# Patient Record
Sex: Female | Born: 1950 | Race: Black or African American | Hispanic: No | Marital: Married | Smoking: Never smoker
Health system: Southern US, Community
[De-identification: ages and names within clinical notes are randomized; demographics above are authoritative.]

## PROBLEM LIST (undated history)

## (undated) DIAGNOSIS — T7840XA Allergy, unspecified, initial encounter: Secondary | ICD-10-CM

## (undated) DIAGNOSIS — K635 Polyp of colon: Secondary | ICD-10-CM

## (undated) DIAGNOSIS — L509 Urticaria, unspecified: Secondary | ICD-10-CM

## (undated) DIAGNOSIS — D472 Monoclonal gammopathy: Secondary | ICD-10-CM

## (undated) DIAGNOSIS — E669 Obesity, unspecified: Secondary | ICD-10-CM

## (undated) DIAGNOSIS — M797 Fibromyalgia: Secondary | ICD-10-CM

## (undated) DIAGNOSIS — D649 Anemia, unspecified: Secondary | ICD-10-CM

## (undated) DIAGNOSIS — G4730 Sleep apnea, unspecified: Secondary | ICD-10-CM

## (undated) DIAGNOSIS — N390 Urinary tract infection, site not specified: Secondary | ICD-10-CM

## (undated) DIAGNOSIS — M459 Ankylosing spondylitis of unspecified sites in spine: Secondary | ICD-10-CM

## (undated) DIAGNOSIS — I10 Essential (primary) hypertension: Secondary | ICD-10-CM

## (undated) DIAGNOSIS — E7800 Pure hypercholesterolemia, unspecified: Secondary | ICD-10-CM

## (undated) HISTORY — PX: BREAST SURGERY: SHX581

## (undated) HISTORY — DX: Fibromyalgia: M79.7

## (undated) HISTORY — DX: Allergy, unspecified, initial encounter: T78.40XA

## (undated) HISTORY — DX: Urinary tract infection, site not specified: N39.0

## (undated) HISTORY — PX: SHOULDER SURGERY: SHX246

## (undated) HISTORY — PX: COLONOSCOPY: SHX174

## (undated) HISTORY — PX: HERNIA MESH REMOVAL: SHX1745

## (undated) HISTORY — DX: Anemia, unspecified: D64.9

## (undated) HISTORY — DX: Urticaria, unspecified: L50.9

## (undated) HISTORY — PX: ABDOMINAL HYSTERECTOMY: SHX81

## (undated) HISTORY — DX: Obesity, unspecified: E66.9

## (undated) HISTORY — PX: POLYPECTOMY: SHX149

## (undated) HISTORY — DX: Sleep apnea, unspecified: G47.30

## (undated) HISTORY — DX: Polyp of colon: K63.5

---

## 1999-05-14 ENCOUNTER — Ambulatory Visit (HOSPITAL_COMMUNITY): Admission: RE | Admit: 1999-05-14 | Discharge: 1999-05-14 | Payer: Self-pay | Admitting: Family Medicine

## 1999-05-14 ENCOUNTER — Encounter: Payer: Self-pay | Admitting: Family Medicine

## 2001-01-01 ENCOUNTER — Ambulatory Visit (HOSPITAL_COMMUNITY): Admission: RE | Admit: 2001-01-01 | Discharge: 2001-01-01 | Payer: Self-pay | Admitting: Family Medicine

## 2001-01-01 ENCOUNTER — Encounter: Payer: Self-pay | Admitting: Family Medicine

## 2001-12-30 ENCOUNTER — Emergency Department (HOSPITAL_COMMUNITY): Admission: EM | Admit: 2001-12-30 | Discharge: 2001-12-30 | Payer: Self-pay | Admitting: *Deleted

## 2003-06-14 DIAGNOSIS — E669 Obesity, unspecified: Secondary | ICD-10-CM

## 2003-06-14 HISTORY — DX: Obesity, unspecified: E66.9

## 2004-04-13 ENCOUNTER — Ambulatory Visit: Payer: Self-pay | Admitting: Family Medicine

## 2006-04-18 ENCOUNTER — Other Ambulatory Visit: Admission: RE | Admit: 2006-04-18 | Discharge: 2006-04-18 | Payer: Self-pay | Admitting: Family Medicine

## 2007-04-03 ENCOUNTER — Emergency Department (HOSPITAL_COMMUNITY): Admission: EM | Admit: 2007-04-03 | Discharge: 2007-04-04 | Payer: Self-pay | Admitting: *Deleted

## 2009-06-08 ENCOUNTER — Emergency Department (HOSPITAL_COMMUNITY): Admission: EM | Admit: 2009-06-08 | Discharge: 2009-06-08 | Payer: Self-pay | Admitting: Emergency Medicine

## 2011-03-23 LAB — CBC
HCT: 36.5
Hemoglobin: 12.1
MCV: 84.4
RBC: 4.33
WBC: 7.2

## 2011-03-23 LAB — COMPREHENSIVE METABOLIC PANEL
Alkaline Phosphatase: 96
BUN: 6
CO2: 27
Chloride: 98
Creatinine, Ser: 0.99
GFR calc non Af Amer: 58 — ABNORMAL LOW
Glucose, Bld: 102 — ABNORMAL HIGH
Total Bilirubin: 0.8

## 2011-03-23 LAB — DIFFERENTIAL
Basophils Absolute: 0.1
Basophils Relative: 1
Eosinophils Relative: 5
Lymphocytes Relative: 37
Neutro Abs: 3.6

## 2011-03-23 LAB — POCT CARDIAC MARKERS
Myoglobin, poc: 87.8
Operator id: 4533

## 2011-03-23 LAB — D-DIMER, QUANTITATIVE: D-Dimer, Quant: 2.12 — ABNORMAL HIGH

## 2011-06-14 DIAGNOSIS — K635 Polyp of colon: Secondary | ICD-10-CM

## 2011-06-14 HISTORY — DX: Polyp of colon: K63.5

## 2011-10-01 ENCOUNTER — Encounter (HOSPITAL_COMMUNITY): Payer: Self-pay | Admitting: Family Medicine

## 2011-10-01 ENCOUNTER — Emergency Department (HOSPITAL_COMMUNITY): Payer: BC Managed Care – PPO

## 2011-10-01 ENCOUNTER — Emergency Department (HOSPITAL_COMMUNITY)
Admission: EM | Admit: 2011-10-01 | Discharge: 2011-10-01 | Disposition: A | Discharge status: Home / Self Care | Payer: BC Managed Care – PPO | Attending: Emergency Medicine | Admitting: Emergency Medicine

## 2011-10-01 DIAGNOSIS — IMO0002 Reserved for concepts with insufficient information to code with codable children: Secondary | ICD-10-CM

## 2011-10-01 DIAGNOSIS — S6990XA Unspecified injury of unspecified wrist, hand and finger(s), initial encounter: Secondary | ICD-10-CM

## 2011-10-01 DIAGNOSIS — W230XXA Caught, crushed, jammed, or pinched between moving objects, initial encounter: Secondary | ICD-10-CM | POA: Insufficient documentation

## 2011-10-01 DIAGNOSIS — Z23 Encounter for immunization: Secondary | ICD-10-CM | POA: Insufficient documentation

## 2011-10-01 DIAGNOSIS — S61209A Unspecified open wound of unspecified finger without damage to nail, initial encounter: Secondary | ICD-10-CM | POA: Insufficient documentation

## 2011-10-01 MED ORDER — HYDROCODONE-ACETAMINOPHEN 5-325 MG PO TABS: 1.0000 | ORAL_TABLET | ORAL | Status: AC | PRN

## 2011-10-01 MED ORDER — TETANUS-DIPHTH-ACELL PERTUSSIS 5-2.5-18.5 LF-MCG/0.5 IM SUSP
0.5000 mL | Freq: Once | INTRAMUSCULAR | Status: AC
  Administered 2011-10-01: 0.5 mL via INTRAMUSCULAR
  Filled 2011-10-01 (×2): qty 0.5

## 2011-10-01 NOTE — ED Notes (Signed)
Tiffany, PA at bedside for dermabond.

## 2011-10-01 NOTE — ED Notes (Signed)
Pt reports shutting right fingers in car door. Laceration noted to right pointer finger, bleeding controlled, CMS intact.

## 2011-10-01 NOTE — Discharge Instructions (Signed)

## 2011-10-01 NOTE — ED Provider Notes (Signed)
History     CSN: [id number]  Arrival date & time 10/01/11  1439   First MD Initiated Contact with Patient 10/01/11 [phone]      Chief Complaint  Patient presents with  . Hand Injury    (Consider location/radiation/quality/duration/timing/severity/associated sxs/prior treatment) HPI  Pt presents th eED with complaints of slamming right 2nd, 3rd and 4th fingers in door. She states that the pointer finger has a laceration but the bleeding is controlled at this time. The incident happened at 1:30pm today, the severity is mild, the pain is throbbing.   History reviewed. No pertinent past medical history.  Past Surgical History  Procedure Date  . Abdominal hysterectomy   . Breast surgery     cyst removal    History reviewed. No pertinent family history.  History  Substance Use Topics  . Smoking status: Never Smoker   . Smokeless tobacco: Not on file  . Alcohol Use: No    OB History    Grav Para Term Preterm Abortions TAB SAB Ect Mult Living                  Review of Systems   HEENT: denies blurry vision or change in hearing PULMONARY: Denies difficulty breathing and SOB CARDIAC: denies chest pain or heart palpitations MUSCULOSKELETAL: pt is ambulatory, denies being unable to ambulate ABDOMEN AL: denies abdominal pain GU: denies loss of bowel urinary control NEURO: denies numbness and tingling in extremities   Allergies  Review of patient's allergies indicates no known allergies.  Home Medications   Current Outpatient Rx  Name Route Sig Dispense Refill  . PRAVASTATIN SODIUM 40 MG PO TABS Oral Take 40 mg by mouth daily.    Marland Kitchen HYDROCODONE-ACETAMINOPHEN 5-325 MG PO TABS Oral Take 1 tablet by mouth every 4 (four) hours as needed for pain. 6 tablet 0    BP 149/78  Pulse 77  Temp(Src) 98.1 F (36.7 C) (Oral)  Resp 20  SpO2 99%  Physical Exam  Nursing note and vitals reviewed. Constitutional: She appears well-developed and well-nourished. No distress.  HENT:   Head: Normocephalic and atraumatic.  Eyes: Pupils are equal, round, and reactive to light.  Neck: Normal range of motion. Neck supple.  Cardiovascular: Normal rate and regular rhythm.   Pulmonary/Chest: Effort normal.  Abdominal: Soft.  Musculoskeletal:       Right hand: She exhibits tenderness, bony tenderness and swelling. She exhibits normal range of motion, normal two-point discrimination, normal capillary refill, no deformity and no laceration. normal sensation noted. Normal strength noted.       Hands: Neurological: She is alert.  Skin: Skin is warm and dry.    ED Course  Procedures (including critical care time)  Labs Reviewed - No data to display Dg Hand Complete Right  10/01/2011  *RADIOLOGY REPORT*  Clinical Data: Hand injury.  RIGHT HAND - COMPLETE 3+ VIEW  Comparison: None.  Findings: There is no evidence for acute fracture or subluxation. Soft tissue gas is identified along the volar aspect of the mid index finger.  No associated radiopaque foreign body in this region.  There is deformity of the proximal aspect of the distal second phalanx this is favored to represent degenerative change rather than acute fracture.  Correlation is recommended with history.  IMPRESSION:  1.  No definite evidence for acute fracture.  See above. 2.  Soft tissue gas along the anterior mid index finger.  Original Report Authenticated By: Patterson Hammersmith, M.D.     1.  Finger injury   2. Laceration       MDM  LACERATION REPAIR Performed by: Dorthula Matas Authorized by: Dorthula Matas Consent: Verbal consent obtained. Risks and benefits: risks, benefits and alternatives were discussed Consent given by: patient Patient identity confirmed: provided demographic data Prepped and Draped in normal sterile fashion Wound explored  Laceration Location: posterior MIP joint,  Laceration Length: 1 cm  No Foreign Bodies seen or palpated  Anesthesia: local infiltration  Local  anesthetic: None  Anesthetic total: none  Irrigation method: syringe Amount of cleaning: standard  Skin closure: skin glue  Number of sutures: none  Technique: skin glue  Patient tolerance: Patient tolerated the procedure well with no immediate complications.  Pt has been advised of the symptoms that warrant their return to the ED. Patient has voiced understanding and has agreed to follow-up with the PCP or specialist.        Dorthula Matas, PA 10/01/11 [phone]

## 2011-10-02 NOTE — ED Provider Notes (Signed)
Medical screening examination/treatment/procedure(s) were performed by non-physician practitioner and as supervising physician I was immediately available for consultation/collaboration.  Jacinda Kanady T Bana Borgmeyer, MD 10/02/11 2329 

## 2011-11-01 ENCOUNTER — Encounter (HOSPITAL_BASED_OUTPATIENT_CLINIC_OR_DEPARTMENT_OTHER): Payer: Self-pay

## 2011-11-01 ENCOUNTER — Emergency Department (HOSPITAL_BASED_OUTPATIENT_CLINIC_OR_DEPARTMENT_OTHER)
Admission: EM | Admit: 2011-11-01 | Discharge: 2011-11-01 | Disposition: A | Discharge status: Home / Self Care | Payer: Worker's Compensation | Attending: Emergency Medicine | Admitting: Emergency Medicine

## 2011-11-01 DIAGNOSIS — S0083XA Contusion of other part of head, initial encounter: Secondary | ICD-10-CM

## 2011-11-01 DIAGNOSIS — S0003XA Contusion of scalp, initial encounter: Secondary | ICD-10-CM | POA: Insufficient documentation

## 2011-11-01 DIAGNOSIS — Y9269 Other specified industrial and construction area as the place of occurrence of the external cause: Secondary | ICD-10-CM | POA: Insufficient documentation

## 2011-11-01 DIAGNOSIS — W208XXA Other cause of strike by thrown, projected or falling object, initial encounter: Secondary | ICD-10-CM | POA: Insufficient documentation

## 2011-11-01 DIAGNOSIS — IMO0002 Reserved for concepts with insufficient information to code with codable children: Secondary | ICD-10-CM | POA: Insufficient documentation

## 2011-11-01 DIAGNOSIS — R51 Headache: Secondary | ICD-10-CM | POA: Insufficient documentation

## 2011-11-01 DIAGNOSIS — Y990 Civilian activity done for income or pay: Secondary | ICD-10-CM | POA: Insufficient documentation

## 2011-11-01 HISTORY — DX: Pure hypercholesterolemia, unspecified: E78.00

## 2011-11-01 MED ORDER — ACETAMINOPHEN 325 MG PO TABS: 975.0000 mg | ORAL_TABLET | Freq: Once | ORAL | Status: DC

## 2011-11-01 NOTE — ED Notes (Signed)
Spoke with pts HR director at work and was told that we do not need a UDS for this incident. She stated that it is [place] and they are not allowed to require that.  Lindsay Bass is the HR director for [employer].

## 2011-11-01 NOTE — ED Notes (Signed)
[facility] report-pt was at work-bent over/under desk-knocked a 10-15 lb metal box onto head-was struck left cheek-no break in skin-no LOC-no treatment PTA

## 2011-11-01 NOTE — Discharge Instructions (Signed)
Contusion A contusion is a deep bruise. Contusions are the result of an injury that caused bleeding under the skin. The contusion may turn blue, purple, or yellow. Minor injuries will give you a painless contusion, but more severe contusions may stay painful and swollen for a few weeks.  CAUSES  A contusion is usually caused by a blow, trauma, or direct force to an area of the body. SYMPTOMS   Swelling and redness of the injured area.   Bruising of the injured area.   Tenderness and soreness of the injured area.   Pain.  DIAGNOSIS  The diagnosis can be made by taking a history and physical exam. An X-ray, CT scan, or MRI may be needed to determine if there were any associated injuries, such as fractures. TREATMENT  Specific treatment will depend on what area of the body was injured. In general, the best treatment for a contusion is resting, icing, elevating, and applying cold compresses to the injured area. Over-the-counter medicines may also be recommended for pain control. Ask your caregiver what the best treatment is for your contusion. HOME CARE INSTRUCTIONS   Put ice on the injured area.   Put ice in a plastic bag.   Place a towel between your skin and the bag.   Leave the ice on for 15 to 20 minutes, 3 to 4 times a day.   Only take over-the-counter or prescription medicines for pain, discomfort, or fever as directed by your caregiver. Your caregiver may recommend avoiding anti-inflammatory medicines (aspirin, ibuprofen, and naproxen) for 48 hours because these medicines may increase bruising.   Rest the injured area.   If possible, elevate the injured area to reduce swelling.  SEEK IMMEDIATE MEDICAL CARE IF:   You have increased bruising or swelling.   You have pain that is getting worse.   Your swelling or pain is not relieved with medicines.  MAKE SURE YOU:   Understand these instructions.   Will watch your condition.   Will get help right away if you are not  doing well or get worse.  Document Released: 03/09/2005 Document Revised: 05/19/2011 Document Reviewed: 04/04/2011 [facility] Patient Information 2012 [place], [place].   Contact her supervisor to see your work comp clinic if you do not feel that you can return to work full duty tomorrow. Take Tylenol as directed for pain

## 2011-11-01 NOTE — ED Notes (Signed)
Pt repots that a metal 10lb meter fell from a table and struck her in the face-approx 2pm-no LOC

## 2011-11-01 NOTE — ED Provider Notes (Signed)
History     CSN: [id number]  Arrival date & time 11/01/11  1458   First MD Initiated Contact with Patient 11/01/11 1611      Chief Complaint  Patient presents with  . Head Injury    (Consider location/radiation/quality/duration/timing/severity/associated sxs/prior treatment) HPI Reports that a metal meter weighing approximately 10 pounds fell from a height of approximately 3 feet striking her in the left face 2 PM today complains of left facial pain and an abrasion at right forearm as a result of the event no other complaint pain is mild not made better or worse by anything no loss of consciousness no visual changes no hearing changes no neck pain no other associated symptoms pain is not made better or worse by anything, nonradiating Past Medical History  Diagnosis Date  . High cholesterol     Past Surgical History  Procedure Date  . Abdominal hysterectomy   . Breast surgery     cyst removal    No family history on file.  History  Substance Use Topics  . Smoking status: Never Smoker   . Smokeless tobacco: Not on file  . Alcohol Use: No    OB History    Grav Para Term Preterm Abortions TAB SAB Ect Mult Living                  Review of Systems  Constitutional: Negative.   HENT: Negative.        Left facial pain  Respiratory: Negative.   Cardiovascular: Negative.   Gastrointestinal: Negative.   Musculoskeletal: Negative.   Skin: Positive for wound.       Abrasion right forearm  Neurological: Negative.   Hematological: Negative.   Psychiatric/Behavioral: Negative.     Allergies  Review of patient's allergies indicates no known allergies.  Home Medications   Current Outpatient Rx  Name Route Sig Dispense Refill  . ACETAMINOPHEN ER 650 MG PO TBCR Oral Take 650 mg by mouth every 8 (eight) hours as needed. Patient used this medication for pain.    . ONE-A-DAY 50 PLUS PO Oral Take 1 tablet by mouth daily.    Marland Kitchen NAPROXEN SODIUM 220 MG PO TABS Oral Take 440 mg  by mouth 2 (two) times daily with a meal. Patient used this medication for her toothache.    Marland Kitchen PRAVASTATIN SODIUM 40 MG PO TABS Oral Take 40 mg by mouth daily.      There were no vitals taken for this visit.  Physical Exam  Nursing note and vitals reviewed. Constitutional: She is oriented to person, place, and time. She appears well-developed and well-nourished.  HENT:  Head: Normocephalic and atraumatic.       No soft tissue swelling no facial asymmetry Minimally tender at left cheek, no ecchymosis poor dentition no trismus no malocclusion  Eyes: Conjunctivae are normal. Pupils are equal, round, and reactive to light.  Neck: Neck supple. No tracheal deviation present. No thyromegaly present.  Cardiovascular: Normal rate and regular rhythm.   No murmur heard. Pulmonary/Chest: Effort normal and breath sounds normal.  Abdominal: Soft. Bowel sounds are normal. She exhibits no distension. There is no tenderness.       Obese  Musculoskeletal: Normal range of motion. She exhibits no edema and no tenderness.       Entire spine nontender  Neurological: She is alert and oriented to person, place, and time. Coordination normal.       Gait normal Romberg normal pronator normal  Skin: Skin is warm and  dry. No rash noted.       Dime sized abrasion right volar forearm, clean appearing  Psychiatric: She has a normal mood and affect.    ED Course  Procedures (including critical care time)  Labs Reviewed - No data to display No results found.   No diagnosis found.    MDM  Imaging not indicated. Discussed with patient who agrees. Patient current on tetanus immunizations Patient declines pain medicine in the ED Plan Tylenol as needed Followup with workers comp clinic if cannot return to full duty tomorrow Diagnosis #1 contusion left face #2 abrasion right forearm        Doug Sou, MD 11/01/11 1610

## 2012-02-09 ENCOUNTER — Encounter: Payer: Self-pay | Admitting: Gastroenterology

## 2012-03-08 ENCOUNTER — Encounter: Payer: Self-pay | Admitting: Gastroenterology

## 2012-03-29 ENCOUNTER — Ambulatory Visit (AMBULATORY_SURGERY_CENTER): Payer: BC Managed Care – PPO | Admitting: *Deleted

## 2012-03-29 VITALS — Ht 61.0 in | Wt 189.6 lb

## 2012-03-29 DIAGNOSIS — Z86010 Personal history of colonic polyps: Secondary | ICD-10-CM

## 2012-03-29 DIAGNOSIS — Z1211 Encounter for screening for malignant neoplasm of colon: Secondary | ICD-10-CM

## 2012-03-29 MED ORDER — PEG-KCL-NACL-NASULF-NA ASC-C 100 G PO SOLR: ORAL | Status: DC

## 2012-03-29 NOTE — Progress Notes (Signed)
No allergies to eggs or soy products 

## 2012-04-06 ENCOUNTER — Ambulatory Visit (AMBULATORY_SURGERY_CENTER): Payer: BC Managed Care – PPO | Admitting: Gastroenterology

## 2012-04-06 ENCOUNTER — Encounter: Payer: Self-pay | Admitting: Gastroenterology

## 2012-04-06 VITALS — BP 123/77 | HR 74 | Temp 97.8°F | Resp 21 | Ht 61.0 in | Wt 189.0 lb

## 2012-04-06 DIAGNOSIS — Z1211 Encounter for screening for malignant neoplasm of colon: Secondary | ICD-10-CM

## 2012-04-06 DIAGNOSIS — D126 Benign neoplasm of colon, unspecified: Secondary | ICD-10-CM

## 2012-04-06 DIAGNOSIS — D129 Benign neoplasm of anus and anal canal: Secondary | ICD-10-CM

## 2012-04-06 DIAGNOSIS — D128 Benign neoplasm of rectum: Secondary | ICD-10-CM

## 2012-04-06 MED ORDER — SODIUM CHLORIDE 0.9 % IV SOLN: 500.0000 mL | INTRAVENOUS | Status: DC

## 2012-04-06 NOTE — Op Note (Signed)
[facility] [address] [place], [address]   COLONOSCOPY PROCEDURE REPORT  PATIENT: Lindsay Bass, Lindsay Bass  MR#: [id number] BIRTHDATE: 23-Nov-1950 , 61  yrs. old GENDER: Female ENDOSCOPIST: Mardella Layman, MD, [facility] REFERRED BY: PROCEDURE DATE:  04/06/2012 PROCEDURE:   Colonoscopy with biopsy ASA CLASS:   Class II INDICATIONS:average risk patient for colon cancer. MEDICATIONS: propofol (Diprivan) 200mg  IV  DESCRIPTION OF PROCEDURE:   After the risks and benefits and of the procedure were explained, informed consent was obtained.  A digital rectal exam revealed no abnormalities of the rectum.    The LB CF-[id number]  endoscope was introduced through the anus and advanced to the cecum, which was identified by both the appendix and ileocecal valve .  The quality of the prep was excellent, using MoviPrep .  The instrument was then slowly withdrawn as the colon was fully examined.     COLON FINDINGS: A normal appearing cecum, ileocecal valve, and appendiceal orifice were identified.  The ascending, hepatic flexure, transverse, splenic flexure, descending, sigmoid colon and rectum appeared unremarkable.  No polyps or cancers were seen.  A biopsy was performed.     Retroflexed views revealed no abnormalities.     The scope was then withdrawn from the patient and the procedure completed.  COMPLICATIONS: There were no complications. ENDOSCOPIC IMPRESSION: Normal colon; biopsy was performed ...r/o adenoma...  RECOMMENDATIONS: 1.  Repeat colonoscopy in 5 years if polyp adenomatous; otherwise 10 years 2.  Await pathology results 3.  Continue current medications   REPEAT EXAM:  XB:[id number] White, MD  _______________________________ eSigned:  Mardella Layman, MD, [facility] 04/06/2012 2:19 PM

## 2012-04-06 NOTE — Patient Instructions (Addendum)

## 2012-04-09 ENCOUNTER — Telehealth: Payer: Self-pay | Admitting: *Deleted

## 2012-04-09 NOTE — Telephone Encounter (Signed)
  Follow up Call-  Call back number 04/06/2012  Post procedure Call Back phone  # [phone]  Permission to leave phone message Yes     No answer and answering machine did not pick up to leave a message.

## 2012-04-10 ENCOUNTER — Encounter: Payer: Self-pay | Admitting: Gastroenterology

## 2013-06-13 DIAGNOSIS — N390 Urinary tract infection, site not specified: Secondary | ICD-10-CM

## 2013-06-13 DIAGNOSIS — M797 Fibromyalgia: Secondary | ICD-10-CM

## 2013-06-13 HISTORY — DX: Fibromyalgia: M79.7

## 2013-06-13 HISTORY — DX: Urinary tract infection, site not specified: N39.0

## 2013-10-27 IMAGING — CR DG HAND COMPLETE 3+V*R*
3 series · 3 of 3 positions shown · non-contrast
Comparison: None.

CLINICAL DATA: Hand injury.

RIGHT HAND - COMPLETE 3+ VIEW

[x hand pa right]
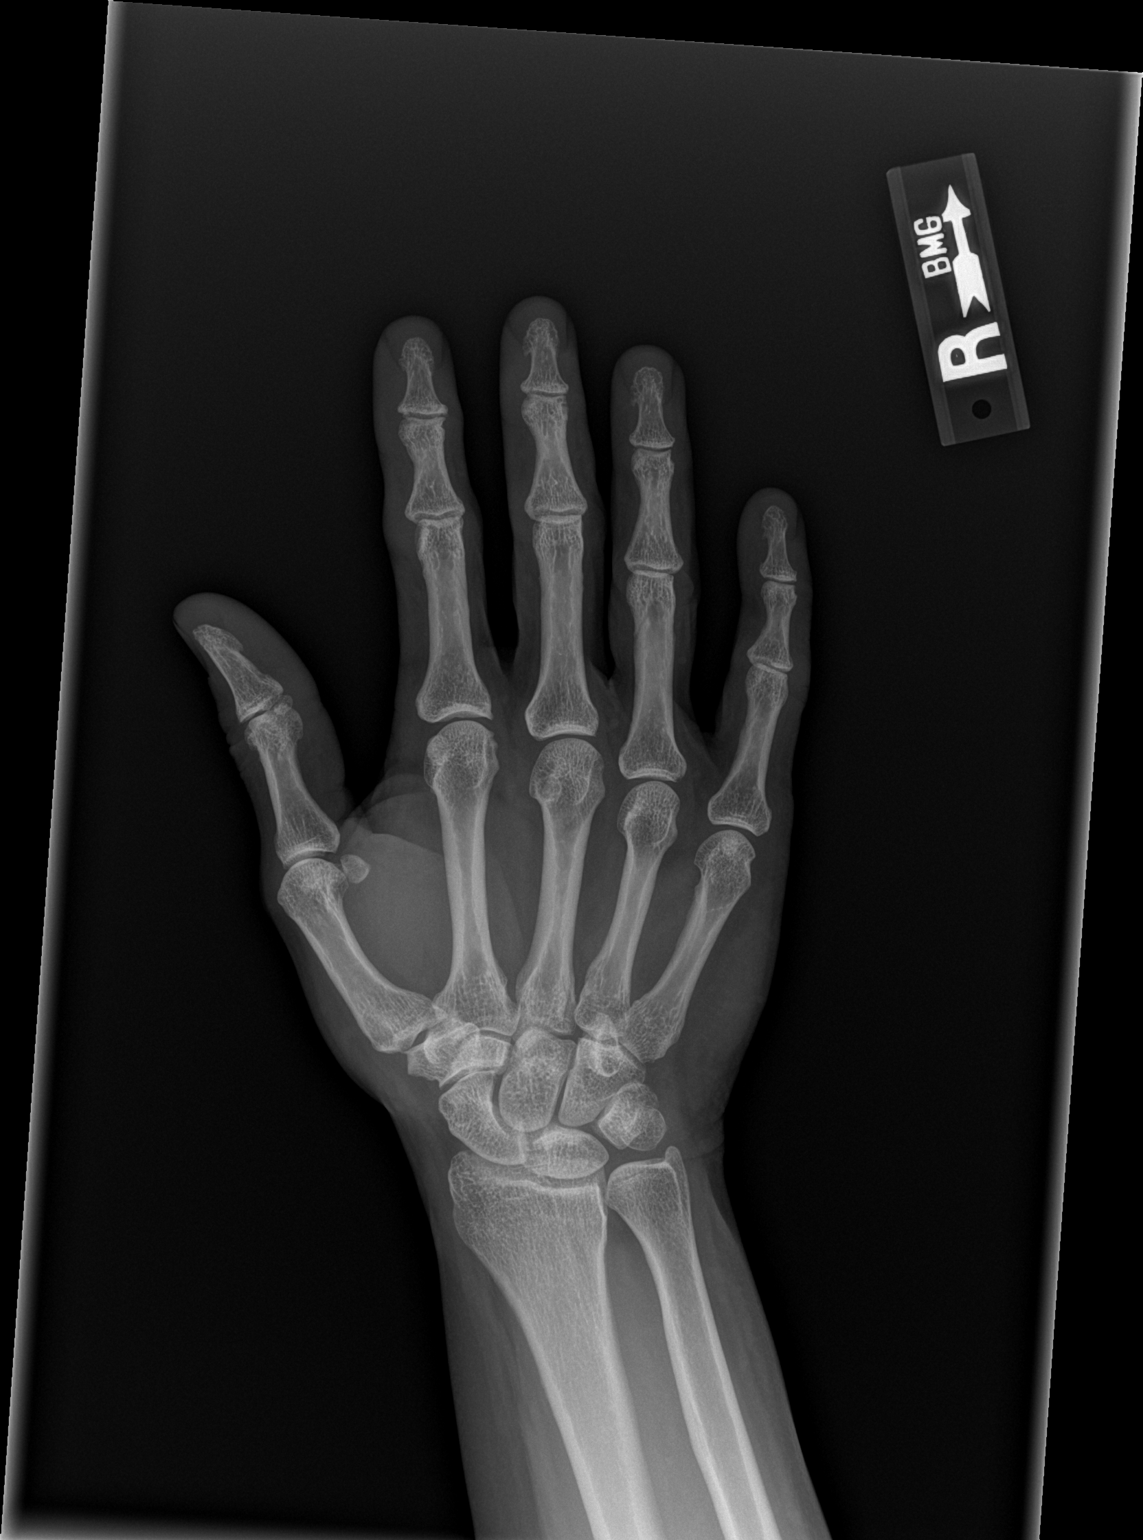

[x hand obl right]
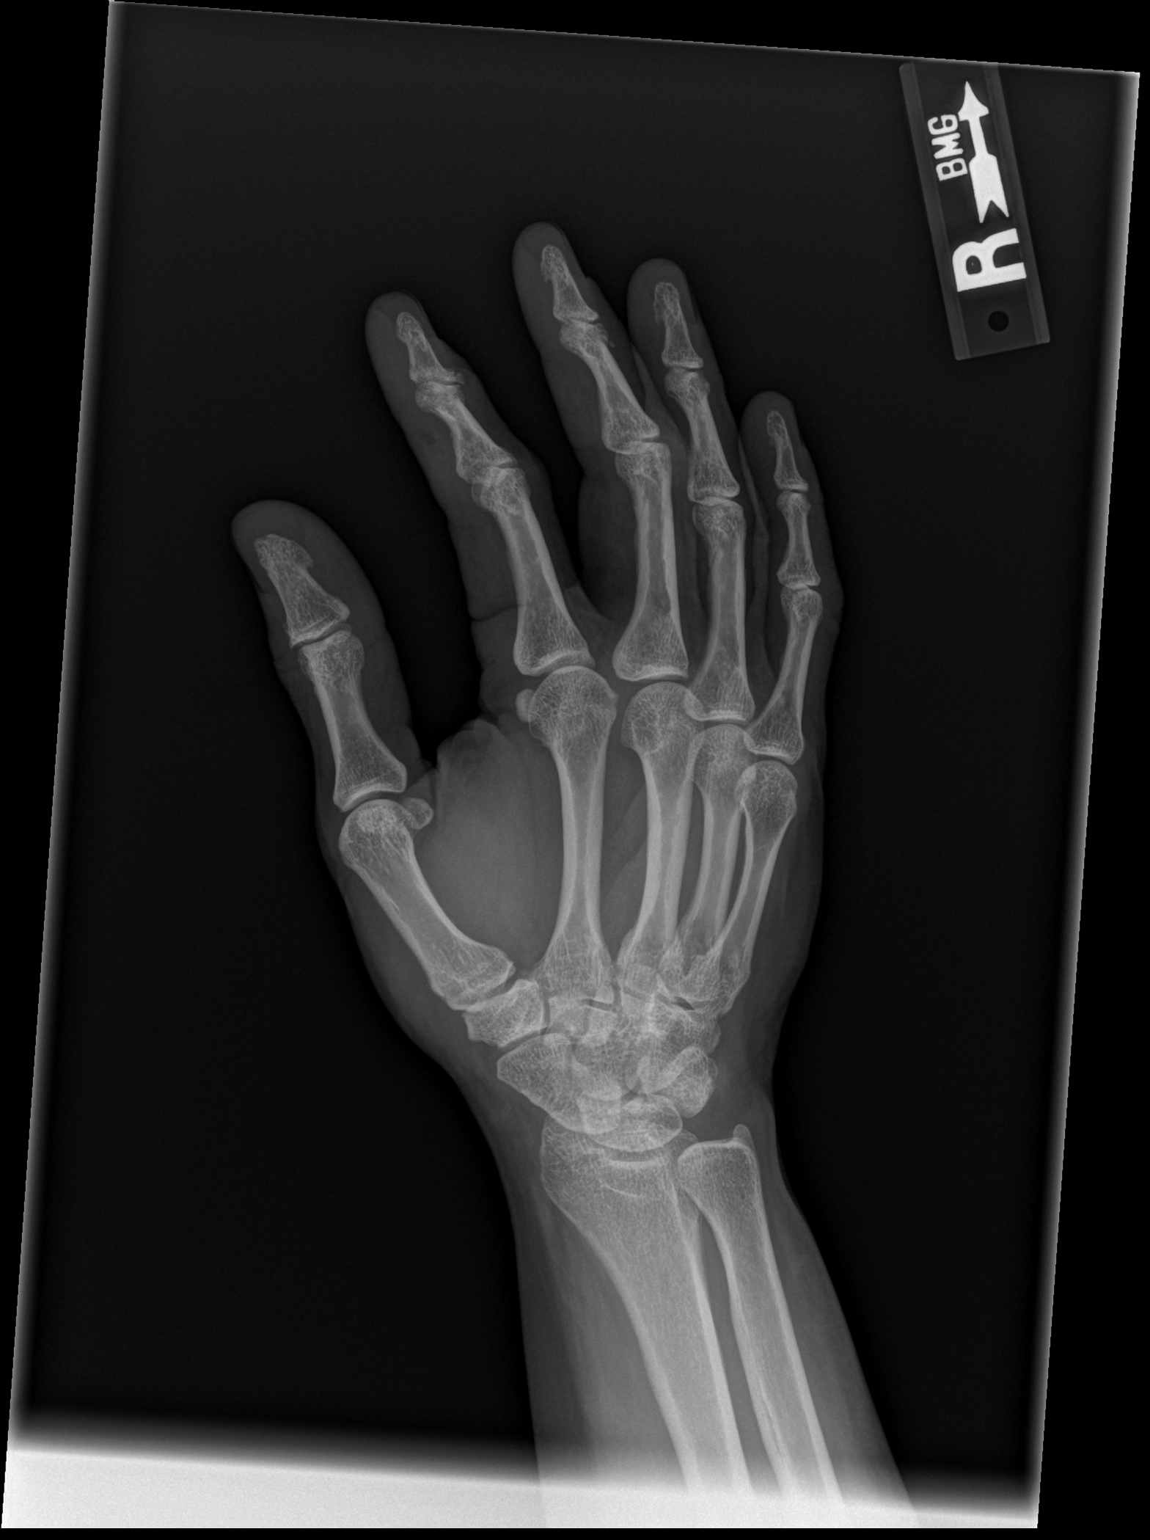

[x hand lat right]
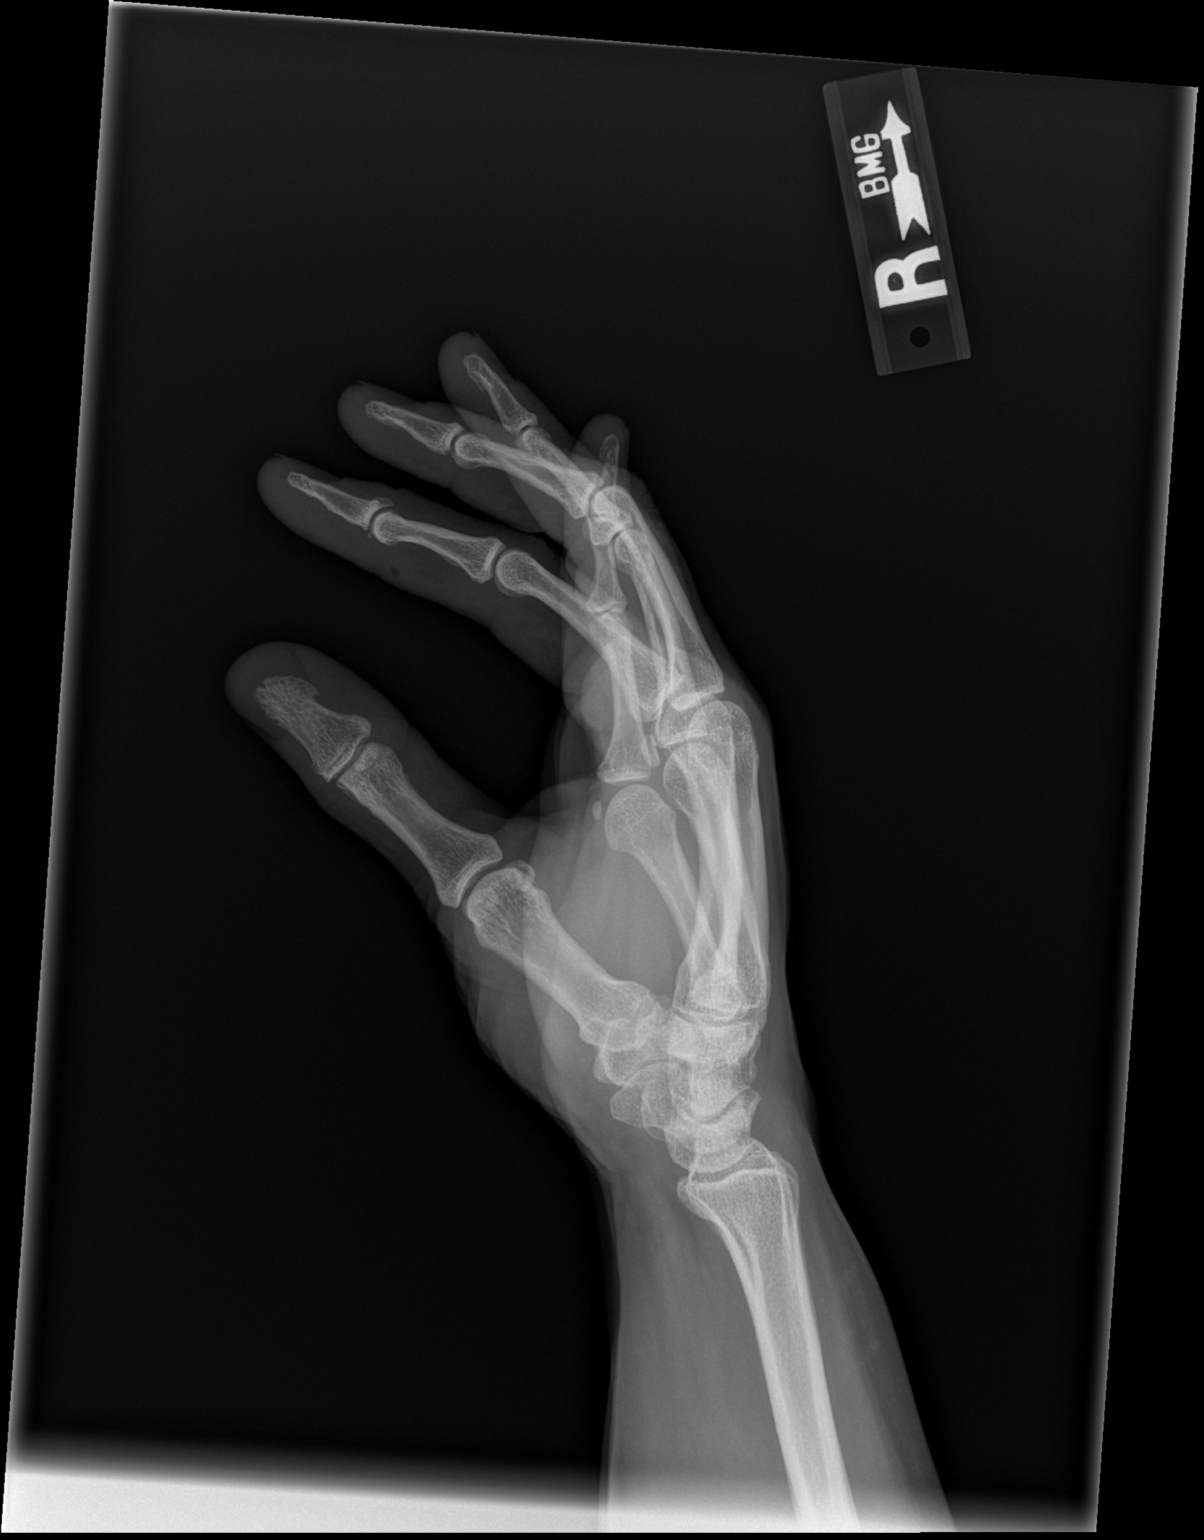

[3 of 3 positions shown; findings below may reference images not displayed]

FINDINGS: There is no evidence for acute fracture or subluxation.
Soft tissue gas is identified along the volar aspect of the mid
index finger.  No associated radiopaque foreign body in this
region.  There is deformity of the proximal aspect of the distal
second phalanx this is favored to represent degenerative change
rather than acute fracture.  Correlation is recommended with
history.
IMPRESSION: 1.  No definite evidence for acute fracture.  See above.
2.  Soft tissue gas along the anterior mid index finger.

## 2014-04-03 ENCOUNTER — Emergency Department (HOSPITAL_COMMUNITY)
Admission: EM | Admit: 2014-04-03 | Discharge: 2014-04-03 | Disposition: A | Discharge status: Home / Self Care | Payer: BC Managed Care – PPO | Attending: Emergency Medicine | Admitting: Emergency Medicine

## 2014-04-03 ENCOUNTER — Encounter (HOSPITAL_COMMUNITY): Payer: Self-pay | Admitting: Emergency Medicine

## 2014-04-03 ENCOUNTER — Emergency Department (HOSPITAL_COMMUNITY): Payer: BC Managed Care – PPO

## 2014-04-03 DIAGNOSIS — G4730 Sleep apnea, unspecified: Secondary | ICD-10-CM | POA: Diagnosis not present

## 2014-04-03 DIAGNOSIS — M25512 Pain in left shoulder: Secondary | ICD-10-CM | POA: Diagnosis not present

## 2014-04-03 DIAGNOSIS — R51 Headache: Secondary | ICD-10-CM | POA: Diagnosis not present

## 2014-04-03 DIAGNOSIS — R42 Dizziness and giddiness: Secondary | ICD-10-CM | POA: Diagnosis not present

## 2014-04-03 DIAGNOSIS — R079 Chest pain, unspecified: Secondary | ICD-10-CM | POA: Insufficient documentation

## 2014-04-03 DIAGNOSIS — Z79899 Other long term (current) drug therapy: Secondary | ICD-10-CM | POA: Diagnosis not present

## 2014-04-03 DIAGNOSIS — E780 Pure hypercholesterolemia: Secondary | ICD-10-CM | POA: Diagnosis not present

## 2014-04-03 DIAGNOSIS — Z9981 Dependence on supplemental oxygen: Secondary | ICD-10-CM | POA: Insufficient documentation

## 2014-04-03 LAB — BASIC METABOLIC PANEL
ANION GAP: 12 (ref 5–15)
BUN: 12 mg/dL (ref 6–23)
CO2: 25 meq/L (ref 19–32)
CREATININE: 0.96 mg/dL (ref 0.50–1.10)
Calcium: 9.3 mg/dL (ref 8.4–10.5)
Chloride: 103 mEq/L (ref 96–112)
GFR calc Af Amer: 72 mL/min — ABNORMAL LOW (ref >90)
GFR calc non Af Amer: 63 mL/min — ABNORMAL LOW (ref >90)
GLUCOSE: 96 mg/dL (ref 70–99)
POTASSIUM: 3.8 meq/L (ref 3.7–5.3)
SODIUM: 140 meq/L (ref 137–147)

## 2014-04-03 LAB — I-STAT TROPONIN, ED
TROPONIN I, POC: 0 ng/mL (ref 0.00–0.08)
Troponin i, poc: 0 ng/mL (ref 0.00–0.08)

## 2014-04-03 LAB — CBC
HCT: 34.3 % — ABNORMAL LOW (ref 36.0–46.0)
HEMOGLOBIN: 11.2 g/dL — AB (ref 12.0–15.0)
MCH: 27.9 pg (ref 26.0–34.0)
MCHC: 32.7 g/dL (ref 30.0–36.0)
MCV: 85.5 fL (ref 78.0–100.0)
Platelets: 159 10*3/uL (ref 150–400)
RBC: 4.01 MIL/uL (ref 3.87–5.11)
RDW: 12.5 % (ref 11.5–15.5)
WBC: 6.3 10*3/uL (ref 4.0–10.5)

## 2014-04-03 LAB — PRO B NATRIURETIC PEPTIDE: Pro B Natriuretic peptide (BNP): 116.1 pg/mL (ref 0–125)

## 2014-04-03 MED ORDER — ASPIRIN 81 MG PO CHEW
324.0000 mg | CHEWABLE_TABLET | Freq: Once | ORAL | Status: AC
  Administered 2014-04-03: 324 mg via ORAL
  Filled 2014-04-03: qty 4

## 2014-04-03 MED ORDER — ACETAMINOPHEN 500 MG PO TABS
1000.0000 mg | ORAL_TABLET | Freq: Once | ORAL | Status: AC
  Administered 2014-04-03: 1000 mg via ORAL
  Filled 2014-04-03: qty 2

## 2014-04-03 NOTE — ED Provider Notes (Signed)
CSN: [id number]     Arrival date & time 04/03/14  1338 History   First MD Initiated Contact with Patient 04/03/14 1719     Chief Complaint  Patient presents with  . Chest Pain  . Shoulder Pain     (Consider location/radiation/quality/duration/timing/severity/associated sxs/prior Treatment) HPI Comments: 63 year old female with high cholesterol, sleep apnea history presents after episode of sharp central chest pain this started after eating lunch. Patient had mild lightheadedness at the time. Patient has had left shoulder pain and arm pain for the past month with random association. Patient denies any exertional symptoms or diaphoresis. Patient denies any cardiac history or blood clot history, no recent surgeries, no active cancer, no hemoptysis, no unilateral leg symptoms.  Patient has no symptoms currently except for mild frontal headache that is similar to previous. Headache is gradual onset and improved with over-the-counter medications. Chest pain was sharp and lasted 10 minutes no focal radiation.  Patient is a 63 y.o. female presenting with chest pain and shoulder pain. The history is provided by the patient.  Chest Pain Associated symptoms: headache   Associated symptoms: no abdominal pain, no back pain, no fever, no shortness of breath and not vomiting   Shoulder Pain Associated symptoms include chest pain and headaches. Pertinent negatives include no abdominal pain and no shortness of breath.    Past Medical History  Diagnosis Date  . High cholesterol   . Allergy   . Sleep apnea     sometimes wear CPAP   Past Surgical History  Procedure Laterality Date  . Abdominal hysterectomy    . Breast surgery      cyst removal, right  . Colonoscopy    . Polypectomy     Family History  Problem Relation Age of Onset  . Colon cancer Neg Hx   . Esophageal cancer Neg Hx   . Stomach cancer Neg Hx   . Rectal cancer Neg Hx   . Breast cancer Maternal Grandmother   . Ovarian cancer  Paternal Grandmother    History  Substance Use Topics  . Smoking status: Never Smoker   . Smokeless tobacco: Never Used  . Alcohol Use: No   OB History   Grav Para Term Preterm Abortions TAB SAB Ect Mult Living                 Review of Systems  Constitutional: Negative for fever and chills.  HENT: Negative for congestion.   Eyes: Negative for visual disturbance.  Respiratory: Negative for shortness of breath.   Cardiovascular: Positive for chest pain.  Gastrointestinal: Negative for vomiting and abdominal pain.  Genitourinary: Negative for dysuria and flank pain.  Musculoskeletal: Negative for back pain, neck pain and neck stiffness.  Skin: Negative for rash.  Neurological: Positive for light-headedness and headaches. Negative for syncope.      Allergies  Review of patient's allergies indicates no known allergies.  Home Medications   Prior to Admission medications   Medication Sig Start Date End Date Taking? Authorizing Provider  acetaminophen (TYLENOL) 500 MG tablet Take 500 mg by mouth daily as needed for moderate pain (pain).   Yes Historical Provider, MD  atorvastatin (LIPITOR) 80 MG tablet Take 80 mg by mouth daily.   Yes Historical Provider, MD  Calcium Carbonate 500 MG CHEW Chew 2 tablets by mouth daily.   Yes Historical Provider, MD  VITAMIN E PO Take 1 tablet by mouth daily as needed (cold symptoms).   Yes Historical Provider, MD  zinc gluconate  50 MG tablet Take 50 mg by mouth daily as needed (cold symptoms).   Yes Historical Provider, MD   BP 144/88  Pulse 74  Temp(Src) 97.7 F (36.5 C) (Oral)  Resp 17  SpO2 98% Physical Exam  Nursing note and vitals reviewed. Constitutional: She is oriented to person, place, and time. She appears well-developed and well-nourished.  HENT:  Head: Normocephalic and atraumatic.  Eyes: Conjunctivae are normal. Right eye exhibits no discharge. Left eye exhibits no discharge.  Neck: Normal range of motion. Neck supple. No  tracheal deviation present.  Cardiovascular: Normal rate, regular rhythm and intact distal pulses.   No murmur heard. Pulmonary/Chest: Effort normal and breath sounds normal.  Abdominal: Soft. She exhibits no distension. There is no tenderness. There is no guarding.  Musculoskeletal: She exhibits no edema.  Neurological: She is alert and oriented to person, place, and time. No cranial nerve deficit. GCS eye subscore is 4. GCS verbal subscore is 5. GCS motor subscore is 6.  5+ strength in UE and LE with f/e at major joints. Sensation to palpation intact in UE and LE. CNs 2-12 grossly intact.  EOMFI.  PERRL.   Finger nose and coordination intact bilateral.   Visual fields intact to finger testing.   Skin: Skin is warm. No rash noted.  Psychiatric: She has a normal mood and affect.    ED Course  Procedures (including critical care time) Labs Review Labs Reviewed  CBC - Abnormal; Notable for the following:    Hemoglobin 11.2 (*)    HCT 34.3 (*)    All other components within normal limits  BASIC METABOLIC PANEL - Abnormal; Notable for the following:    GFR calc non Af Amer 63 (*)    GFR calc Af Amer 72 (*)    All other components within normal limits  PRO B NATRIURETIC PEPTIDE  I-STAT TROPOININ, ED  I-STAT TROPOININ, ED    Imaging Review Dg Chest 2 View (if Patient Has Fever And/or Copd)  04/03/2014   CLINICAL DATA:  Shortness of breath.  Weakness.  Initial encounter.  EXAM: CHEST  2 VIEW  COMPARISON:  04/03/2007  FINDINGS: Thoracic spondylosis. Mildly enlarged cardiopericardial silhouette. No edema. The lungs appear clear. No pleural effusion.  IMPRESSION: 1. Mildly enlarged cardiopericardial silhouette, without edema. 2. Thoracic spondylosis.   Electronically Signed   By: Sherryl Barters M.D.   On: 04/03/2014 15:11   Dg Shoulder Left  04/03/2014   CLINICAL DATA:  Left shoulder pain for the past month. No known injury.  EXAM: LEFT SHOULDER - 2+ VIEW  COMPARISON:  None.   FINDINGS: Minimal inferior glenohumeral spur formation and minimal greater tuberosity hyperostosis. Otherwise, normal appearing bones and soft tissues.  IMPRESSION: Minimal degenerative changes.   Electronically Signed   By: Enrique Sack M.D.   On: 04/03/2014 15:22     EKG Interpretation   Date/Time:  Thursday April 03 2014 13:43:46 EDT Ventricular Rate:  74 PR Interval:  131 QRS Duration: 87 QT Interval:  389 QTC Calculation: 432 R Axis:   85 Text Interpretation:  Sinus rhythm Borderline right axis deviation No  acute ischemia changes similar previous Confirmed by Taiwana Willison  MD, Walsie Smeltz  ([id number]) on 04/03/2014 5:41:08 PM      MDM   Final diagnoses:  Chest pain, unspecified chest pain type   Patient with atypical brief episode of chest pain. Patient is low risk based on risk factors and low-risk based on clinical suspicion for cardiac. Patient has no classic  blood clot risks, no family history of early cardiac disease known. Patient has no chest pain or shortness of breath in ER. Plan for delta troponin and discussed strict reasons to return and close followup outpatient for reassessment and likely stress test primary care Dr. and/or cardiology. Patient comfortable the plan. Tylenol and aspirin given for headache.  Delta troponin negative, patient asymptomatic on recheck. Results and differential diagnosis were discussed with the patient/parent/guardian. Close follow up outpatient was discussed, comfortable with the plan.   Medications  aspirin chewable tablet 324 mg (324 mg Oral Given 04/03/14 1829)  acetaminophen (TYLENOL) tablet 1,000 mg (1,000 mg Oral Given 04/03/14 1829)    Filed Vitals:   04/03/14 1352  BP: 144/88  Pulse: 74  Temp: 97.7 F (36.5 C)  TempSrc: Oral  Resp: 17  SpO2: 98%    Final diagnoses:  Chest pain, unspecified chest pain type        Mariea Clonts, MD 04/03/14 [phone]

## 2014-04-03 NOTE — Discharge Instructions (Signed)
If you were given medicines take as directed.  If you are on coumadin or contraceptives realize their levels and effectiveness is altered by many different medicines.  If you have any reaction (rash, tongues swelling, other) to the medicines stop taking and see a physician.   Please follow up as directed and return to the ER or see a physician for new or worsening symptoms.  Thank you. Filed Vitals:   04/03/14 1352  BP: 144/88  Pulse: 74  Temp: 97.7 F (36.5 C)  TempSrc: Oral  Resp: 17  SpO2: 98%   And Chest Pain (Nonspecific) It is often hard to give a specific diagnosis for the cause of chest pain. There is always a chance that your pain could be related to something serious, such as a heart attack or a blood clot in the lungs. You need to follow up with your health care provider for further evaluation. CAUSES   Heartburn.  Pneumonia or bronchitis.  Anxiety or stress.  Inflammation around your heart (pericarditis) or lung (pleuritis or pleurisy).  A blood clot in the lung.  A collapsed lung (pneumothorax). It can develop suddenly on its own (spontaneous pneumothorax) or from trauma to the chest.  Shingles infection (herpes zoster virus). The chest wall is composed of bones, muscles, and cartilage. Any of these can be the source of the pain.  The bones can be bruised by injury.  The muscles or cartilage can be strained by coughing or overwork.  The cartilage can be affected by inflammation and become sore (costochondritis). DIAGNOSIS  Lab tests or other studies may be needed to find the cause of your pain. Your health care provider may have you take a test called an ambulatory electrocardiogram (ECG). An ECG records your heartbeat patterns over a 24-hour period. You may also have other tests, such as:  Transthoracic echocardiogram (TTE). During echocardiography, sound waves are used to evaluate how blood flows through your heart.  Transesophageal echocardiogram  (TEE).  Cardiac monitoring. This allows your health care provider to monitor your heart rate and rhythm in real time.  Holter monitor. This is a portable device that records your heartbeat and can help diagnose heart arrhythmias. It allows your health care provider to track your heart activity for several days, if needed.  Stress tests by exercise or by giving medicine that makes the heart beat faster. TREATMENT   Treatment depends on what may be causing your chest pain. Treatment may include:  Acid blockers for heartburn.  Anti-inflammatory medicine.  Pain medicine for inflammatory conditions.  Antibiotics if an infection is present.  You may be advised to change lifestyle habits. This includes stopping smoking and avoiding alcohol, caffeine, and chocolate.  You may be advised to keep your head raised (elevated) when sleeping. This reduces the chance of acid going backward from your stomach into your esophagus. Most of the time, nonspecific chest pain will improve within 2-3 days with rest and mild pain medicine.  HOME CARE INSTRUCTIONS   If antibiotics were prescribed, take them as directed. Finish them even if you start to feel better.  For the next few days, avoid physical activities that bring on chest pain. Continue physical activities as directed.  Do not use any tobacco products, including cigarettes, chewing tobacco, or electronic cigarettes.  Avoid drinking alcohol.  Only take medicine as directed by your health care provider.  Follow your health care provider's suggestions for further testing if your chest pain does not go away.  Keep any follow-up  appointments you made. If you do not go to an appointment, you could develop lasting (chronic) problems with pain. If there is any problem keeping an appointment, call to reschedule. SEEK MEDICAL CARE IF:   Your chest pain does not go away, even after treatment.  You have a rash with blisters on your chest.  You have  a fever. SEEK IMMEDIATE MEDICAL CARE IF:   You have increased chest pain or pain that spreads to your arm, neck, jaw, back, or abdomen.  You have shortness of breath.  You have an increasing cough, or you cough up blood.  You have severe back or abdominal pain.  You feel nauseous or vomit.  You have severe weakness.  You faint.  You have chills. This is an emergency. Do not wait to see if the pain will go away. Get medical help at once. Call your local emergency services (911 in [place] Do not drive yourself to the hospital. MAKE SURE YOU:   Understand these instructions.  Will watch your condition.  Will get help right away if you are not doing well or get worse. Document Released: 03/09/2005 Document Revised: 06/04/2013 Document Reviewed: 01/03/2008 [facility] Patient Information 2015 [place], [place]. This information is not intended to replace advice given to you by your health care provider. Make sure you discuss any questions you have with your health care provider.

## 2014-04-03 NOTE — ED Notes (Signed)
Pt reports L side chest pain that began after she had lunch. Reports dizziness and lightheadedness at that time. Dizziness has subsided, but lightheadedness has continued. No nausea. Little bit of SOB. Pt lifts heavy objects at work. Also reports L shoulder and arm pain for the past month. Also had HA this am which decreased after taking OTC pain medication.

## 2014-05-22 ENCOUNTER — Institutional Professional Consult (permissible substitution): Payer: Self-pay | Admitting: Internal Medicine

## 2014-06-05 NOTE — Progress Notes (Signed)
Patient ID: Lindsay Bass, female   DOB: 04/11/1951, 63 y.o.   MRN: [id number]    63 yo seen at [facility] ED 10/15 with SSCP  PMH with  high cholesterol, sleep apnea  Presented  after episode of sharp central chest pain this started after eating lunch. Patient had mild lightheadedness at the time. Patient has had left shoulder pain and arm pain for the past month with random association. Patient denies any exertional symptoms or diaphoresis. Patient denies any cardiac history or blood clot history, no recent surgeries, no active cancer, no hemoptysis, no unilateral leg symptoms. Mild frontal headache that is similar to previous. Headache is gradual onset and improved with over-the-counter medications. Chest pain was sharp and lasted 10 minutes no focal radiation.  R/O with no acute ECG changes and Rx with ASA and Tylenol   CXR reviewed mild CE and thoracic spondylosis   Since ER visit still with atypical pain.  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  All other systems reviewed and negative   General: Affect appropriate Healthy:  appears stated age 8: normal Neck supple with no adenopathy JVP normal no bruits no thyromegaly Lungs clear with no wheezing and good diaphragmatic motion Heart:  S1/S2 no murmur,rub, gallop or click PMI normal Abdomen: benighn, BS positve, no tenderness, no AAA no bruit.  No HSM or HJR Distal pulses intact with no bruits No edema Neuro non-focal Skin warm and dry No muscular weakness  Medications Current Outpatient Prescriptions  Medication Sig Dispense Refill  . acetaminophen (TYLENOL) 500 MG tablet Take 500 mg by mouth daily as  needed for moderate pain (pain).    Marland Kitchen atorvastatin (LIPITOR) 80 MG tablet Take 80 mg by mouth daily.    . Calcium Carbonate 500 MG CHEW Chew 2 tablets by mouth daily.    Marland Kitchen VITAMIN E PO Take 1 tablet by mouth daily as needed (cold symptoms).    . zinc gluconate 50 MG tablet Take 50 mg by mouth daily as needed (cold symptoms).     No current facility-administered medications for this visit.    Allergies Review of patient's allergies indicates no known allergies.  Family History: Family History  Problem Relation Age of Onset  . Colon cancer Neg Hx   . Esophageal cancer Neg Hx   . Stomach cancer Neg Hx   . Rectal cancer Neg Hx   . Breast cancer Maternal Grandmother   . Ovarian cancer Paternal Grandmother     Social History: History   Social History  . Marital Status: Married    Spouse Name: N/A    Number of Children: N/A  . Years of Education: N/A   Occupational History  . Not on file.   Social History Main Topics  . Smoking status: Never Smoker   . Smokeless tobacco: Never Used  . Alcohol Use: No  . Drug Use: No  . Sexual Activity: Not on file   Other Topics Concern  . Not on file   Social History Narrative   Tobacco use cigarettes :Never smoked ,   Tobacco history last updated 07/15/2013.no smoking   Alcohol : Rare no Recreational drugs use. Exercise: very active at work ,no formal exercise. 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 Marital Status: Married Scientist, forensic. Children : Melanie ([place]) Monica (W/S), 3 steps Denman George 12 grands, 1 great grand. Religion: Love and Faith          Past Surgical History  Procedure Laterality Date  . Abdominal hysterectomy    . Breast surgery      cyst removal, right  . Colonoscopy    . Polypectomy      Past Medical History  Diagnosis Date  . High cholesterol   . Allergy   . Sleep apnea     sometimes wear CPAP    Electrocardiogram:  ECG 10/23   SR rate 74  Low voltage nonspecific ST/T wave changes    Assessment and Plan

## 2014-06-09 ENCOUNTER — Ambulatory Visit (INDEPENDENT_AMBULATORY_CARE_PROVIDER_SITE_OTHER): Payer: BC Managed Care – PPO | Admitting: Cardiovascular Disease

## 2014-06-09 ENCOUNTER — Encounter: Payer: Self-pay | Admitting: Gastroenterology

## 2014-06-09 ENCOUNTER — Encounter: Payer: Self-pay | Admitting: Cardiovascular Disease

## 2014-06-09 VITALS — BP 130/75 | HR 78 | Resp 12 | Wt 187.0 lb

## 2014-06-09 DIAGNOSIS — R079 Chest pain, unspecified: Secondary | ICD-10-CM

## 2014-06-09 DIAGNOSIS — R1310 Dysphagia, unspecified: Secondary | ICD-10-CM | POA: Insufficient documentation

## 2014-06-09 DIAGNOSIS — R0600 Dyspnea, unspecified: Secondary | ICD-10-CM | POA: Insufficient documentation

## 2014-06-09 NOTE — Assessment & Plan Note (Signed)
Chronic problem not w/u  Difficulty swallowing pills refer to GI for possible endoscoopy

## 2014-06-09 NOTE — Assessment & Plan Note (Signed)
Atypical but nonspecific ECG changes and unable to walk on treadmill due to dyspnea.  F/U lexiscan myovue  If negative would consider MRI of left sholder and f/u with primary

## 2014-06-09 NOTE — Assessment & Plan Note (Signed)
Seems functional Normal exam, CXR .  F/U echo to assess RV/LV function

## 2014-06-09 NOTE — Patient Instructions (Addendum)
Your physician has requested that you have an echocardiogram. Echocardiography is a painless test that uses sound waves to create images of your heart. It provides your doctor with information about the size and shape of your heart and how well your heart's chambers and valves are working. This procedure takes approximately one hour. There are no restrictions for this procedure.  Your physician has requested that you have a lexiscan myoview. For further information please visit HugeFiesta.tn. Please follow instruction sheet, as given.  Follow up with Dr. Johnsie Cancel as needed.   You have been referred to [facility] related to Dysphagia.

## 2014-06-17 ENCOUNTER — Ambulatory Visit (HOSPITAL_COMMUNITY): Payer: BLUE CROSS/BLUE SHIELD | Attending: Cardiovascular Disease

## 2014-06-17 ENCOUNTER — Ambulatory Visit (HOSPITAL_BASED_OUTPATIENT_CLINIC_OR_DEPARTMENT_OTHER): Payer: BLUE CROSS/BLUE SHIELD | Admitting: Radiology

## 2014-06-17 DIAGNOSIS — R079 Chest pain, unspecified: Secondary | ICD-10-CM | POA: Diagnosis not present

## 2014-06-17 DIAGNOSIS — R0600 Dyspnea, unspecified: Secondary | ICD-10-CM

## 2014-06-17 DIAGNOSIS — E785 Hyperlipidemia, unspecified: Secondary | ICD-10-CM | POA: Insufficient documentation

## 2014-06-17 DIAGNOSIS — R0609 Other forms of dyspnea: Secondary | ICD-10-CM | POA: Insufficient documentation

## 2014-06-17 MED ORDER — TECHNETIUM TC 99M SESTAMIBI GENERIC - CARDIOLITE
30.0000 | Freq: Once | INTRAVENOUS | Status: AC | PRN
  Administered 2014-06-17: 30 via INTRAVENOUS

## 2014-06-17 MED ORDER — TECHNETIUM TC 99M SESTAMIBI GENERIC - CARDIOLITE
10.0000 | Freq: Once | INTRAVENOUS | Status: AC | PRN
  Administered 2014-06-17: 10 via INTRAVENOUS

## 2014-06-17 MED ORDER — REGADENOSON 0.4 MG/5ML IV SOLN
0.4000 mg | Freq: Once | INTRAVENOUS | Status: AC
  Administered 2014-06-17: 0.4 mg via INTRAVENOUS

## 2014-06-17 NOTE — Progress Notes (Signed)
2D Echo completed. 06/17/2014

## 2014-06-17 NOTE — Progress Notes (Signed)
[facility] [address] [place], [place] [address] [phone]    Cardiology Nuclear Med Study  Lindsay Bass is a 64 y.o. female     MRN : [id number]     DOB: 31-Mar-1951  Procedure Date: 06/17/2014  Nuclear Med Background Indication for Stress Test:  Evaluation for Ischemia History:  No known CAD Cardiac Risk Factors: Lipids  Symptoms:  Chest Pain (last date of chest discomfort was two days ago) and DOE   Nuclear Pre-Procedure Caffeine/Decaff Intake:  None NPO After: 7:00pm   Lungs:  clear O2 Sat: 98% on room air. IV 0.9% NS with Angio Cath:  22g  IV Site: R Hand  IV Started by:  Matilde Haymaker, RN  Chest Size (in):  38 Cup Size: D  Height: 5\' 1"  (1.549 m)  Weight:  186 lb (84.369 kg)  BMI:  Body mass index is 35.16 kg/(m^2). Tech Comments:  n/a    Nuclear Med Study 1 or 2 day study: 1 day  Stress Test Type:  Treadmill/Lexiscan  Reading MD: n/a  Order Authorizing Provider:  Verlin Dike   Resting Radionuclide: Technetium 51m Sestamibi  Resting Radionuclide Dose: 11.0 mCi   Stress Radionuclide:  Technetium 63m Sestamibi  Stress Radionuclide Dose: 33.0 mCi           Stress Protocol Rest HR: 62 Stress HR: 113  Rest BP: 137/91 Stress BP: 130/63  Exercise Time (min): n/a METS: n/a   Predicted Max HR: 159 bpm % Max HR: 71.07 bpm Rate Pressure Product: 16837   Dose of Adenosine (mg):  n/a Dose of Lexiscan: 0.4 mg  Dose of Atropine (mg): n/a Dose of Dobutamine: n/a mcg/kg/min (at max HR)  Stress Test Technologist: Glade Lloyd, BS-ES  Nuclear Technologist:  Earl Many, CNMT     Rest Procedure:  Myocardial perfusion imaging was performed at rest 45 minutes following the intravenous administration of Technetium 26m Sestamibi. Rest ECG: NSR - Normal EKG  Stress Procedure:  The patient received IV Lexiscan 0.4 mg over 15-seconds with concurrent low level exercise and then Technetium 41m Sestamibi was injected at 30-seconds  while the patient continued walking one more minute.  Quantitative spect images were obtained after a 45-minute delay.  During the infusion of Lexiscan the patient complained of fatigue/weakness, SOB and stomach upset.  These symptoms began to subside in recovery.  Stress ECG: No significant change from baseline ECG  QPS Raw Data Images:  Normal; no motion artifact; normal heart/lung ratio. Stress Images:  Normal homogeneous uptake in all areas of the myocardium. Rest Images:  Normal homogeneous uptake in all areas of the myocardium. Subtraction (SDS):  No evidence of ischemia. Transient Ischemic Dilatation (Normal <1.22):  1.07 Lung/Heart Ratio (Normal <0.45):  0.31  Quantitative Gated Spect Images QGS EDV:  71 ml QGS ESV:  30 ml  Impression Exercise Capacity:  Lexiscan with low level exercise. BP Response:  Normal blood pressure response. Clinical Symptoms:  No significant symptoms noted. ECG Impression:  No significant ECG changes with Lexiscan. Comparison with Prior Nuclear Study: No previous nuclear study performed  Overall Impression:  Normal stress nuclear study.  LV Ejection Fraction: 58%.  LV Wall Motion:  Normal Wall Motion   Pixie Casino, MD, [facility] Board Certified in Nuclear Cardiology Attending Cardiologist [place]

## 2014-07-18 ENCOUNTER — Ambulatory Visit (INDEPENDENT_AMBULATORY_CARE_PROVIDER_SITE_OTHER): Payer: BLUE CROSS/BLUE SHIELD | Admitting: Gastroenterology

## 2014-07-18 ENCOUNTER — Encounter: Payer: Self-pay | Admitting: Gastroenterology

## 2014-07-18 VITALS — BP 130/88 | HR 68 | Ht 61.0 in | Wt 190.2 lb

## 2014-07-18 DIAGNOSIS — R1310 Dysphagia, unspecified: Secondary | ICD-10-CM

## 2014-07-18 DIAGNOSIS — K219 Gastro-esophageal reflux disease without esophagitis: Secondary | ICD-10-CM

## 2014-07-18 NOTE — Patient Instructions (Addendum)
You will be set up for an upper endoscopy for GERD, dysphagia on 08-22-14 at 4:00pm You have been scheduled for an endoscopy. Please follow written instructions given to you at your visit today. If you use inhalers (even only as needed), please bring them with you on the day of your procedure. Your physician has requested that you go to www.startemmi.com and enter the access code given to you at your visit today. This web site gives a general overview about your procedure. However, you should still follow specific instructions given to you by our office regarding your preparation for the procedure.  Try taking OTC omeprazole (20mg  pill, one pill every morning 20-30 min before BF).  VK:PQAESLP [place]

## 2014-07-18 NOTE — Progress Notes (Signed)
Review of pertinent gastrointestinal problems: 1. Routine risk for colon cancer: 03/2012 Colonoscopy with Dr. Sharlett Iles for routine risk screening: "normal colonoscopy", however (confusingly) biopsy performed to "r/o adenoma."  Path showed no adenomatous change and he recommended that she have repeat screening examination in 10 years.  HPI: This is a  very pleasant 64 year old woman whom I am meeting for the first time today.  Has trouble swallowing pills, feels like throat is closing. Sometimes occurs with solid food. Has to really chew a lot.  This has been going on for at least a year.  Overall her weight has been increasing.  Does have intermittent acid taste in her mouth, takes antiacid medicine periodically.  Not sure if she's taken PPI.  She has burning on left side of throat at times,   Review of systems: Pertinent positive and negative review of systems were noted in the above HPI section. Complete review of systems was performed and was otherwise normal.    Past Medical History  Diagnosis Date  . High cholesterol   . Allergy   . Sleep apnea     sometimes wear CPAP  . Colon polyps 2013  . Fibromyalgia 2015  . Obesity 2005  . Urinary tract infection 2015    Past Surgical History  Procedure Laterality Date  . Abdominal hysterectomy    . Breast surgery      cyst removal, right  . Colonoscopy    . Polypectomy    . Hernia mesh removal      Current Outpatient Prescriptions  Medication Sig Dispense Refill  . acetaminophen (TYLENOL) 500 MG tablet Take 500 mg by mouth daily as needed for moderate pain (pain).    Marland Kitchen atorvastatin (LIPITOR) 80 MG tablet Take 80 mg by mouth daily.    . Calcium Carbonate 500 MG CHEW Chew 2 tablets by mouth daily.    Marland Kitchen VITAMIN E PO Take 1 tablet by mouth daily as needed (cold symptoms).    . zinc gluconate 50 MG tablet Take 50 mg by mouth daily as needed (cold symptoms).     No current facility-administered medications for this visit.     Allergies as of 07/18/2014  . (No Known Allergies)    Family History  Problem Relation Age of Onset  . Colon cancer Neg Hx   . Esophageal cancer Neg Hx   . Stomach cancer Neg Hx   . Rectal cancer Neg Hx   . Breast cancer Maternal Grandmother   . Ovarian cancer Paternal Grandmother   . Heart attack Mother   . Alzheimer's disease Mother   . Heart attack Maternal Aunt   . Stroke Mother   . Heart attack Mother   . Hypertension Maternal Grandmother   . Thyroid disease Daughter   . Diabetes Neg Hx   . Gallbladder disease Neg Hx     History   Social History  . Marital Status: Married    Spouse Name: N/A    Number of Children: 2  . Years of Education: N/A   Occupational History  . Assembly Operator    Social History Main Topics  . Smoking status: Never Smoker   . Smokeless tobacco: Never Used  . Alcohol Use: No  . Drug Use: No  . Sexual Activity: Not on file   Other Topics Concern  . Not on file   Social History Narrative   Tobacco use cigarettes :Never smoked ,   Tobacco history last updated 07/15/2013.no smoking   Alcohol : Rare no  Recreational drugs use. Exercise: very active at work ,no formal exercise. 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 Marital Status: Married Scientist, forensic. Children : Melanie ([place]) Monica (W/S), 3 steps Denman George 12 grands, 1 great grand. Religion: Love and Faith             Physical Exam: BP 130/88 mmHg  Pulse 68  Ht 5\' 1"  (1.549 m)  Wt 190 lb 4 oz (86.297 kg)  BMI 35.97 kg/m2 Constitutional: generally well-appearing Psychiatric: alert and oriented x3 Eyes: extraocular movements intact Mouth: oral pharynx moist, no lesions Neck: supple no lymphadenopathy Cardiovascular: heart regular rate and rhythm Lungs: clear to auscultation bilaterally Abdomen: soft, nontender, nondistended, no obvious ascites, no peritoneal signs, normal bowel sounds Extremities: no lower extremity edema bilaterally Skin: no lesions on  visible extremities    Assessment and plan: 64 y.o. female with  intermittent GERD, chronic intermittent pill and solid food dysphagia  She has no weight loss and so my suspicion of significant, neoplastic findings is low. This is perhaps GERD related and I have recommended that she start taking a proton pump inhibitor once daily. We will proceed with upper endoscopy at her soonest convenience. She understands GERD may be playing a role, perhaps scar tissue, unlikely neoplasm but that is in the differential.   Cc referring M.D., Jenkins Rouge MD

## 2014-08-01 ENCOUNTER — Encounter: Payer: Self-pay | Admitting: Gastroenterology

## 2014-08-22 ENCOUNTER — Encounter: Payer: Self-pay | Admitting: Gastroenterology

## 2014-08-22 ENCOUNTER — Ambulatory Visit (AMBULATORY_SURGERY_CENTER): Payer: BLUE CROSS/BLUE SHIELD | Admitting: Gastroenterology

## 2014-08-22 VITALS — BP 129/80 | HR 73 | Temp 97.3°F | Resp 24 | Ht 61.0 in | Wt 190.0 lb

## 2014-08-22 DIAGNOSIS — K2950 Unspecified chronic gastritis without bleeding: Secondary | ICD-10-CM | POA: Diagnosis not present

## 2014-08-22 DIAGNOSIS — K209 Esophagitis, unspecified: Secondary | ICD-10-CM | POA: Diagnosis not present

## 2014-08-22 DIAGNOSIS — K219 Gastro-esophageal reflux disease without esophagitis: Secondary | ICD-10-CM

## 2014-08-22 MED ORDER — SODIUM CHLORIDE 0.9 % IV SOLN: 500.0000 mL | INTRAVENOUS | Status: DC

## 2014-08-22 NOTE — Progress Notes (Signed)
Called to room to assist during endoscopic procedure.  Patient ID and intended procedure confirmed with present staff. Received instructions for my participation in the procedure from the performing physician.  

## 2014-08-22 NOTE — Progress Notes (Signed)
Procedure ends, to recovery, report given and VSS. 

## 2014-08-22 NOTE — Patient Instructions (Signed)
YOU HAD AN ENDOSCOPIC PROCEDURE TODAY AT [facility]:   Refer to the procedure report that was given to you for any specific questions about what was found during the examination.  If the procedure report does not answer your questions, please call your gastroenterologist to clarify.  If you requested that your care partner not be given the details of your procedure findings, then the procedure report has been included in a sealed envelope for you to review at your convenience later.  YOU SHOULD EXPECT: Some feelings of bloating in the abdomen. Passage of more gas than usual.  Walking can help get rid of the air that was put into your GI tract during the procedure and reduce the bloating. If you had a lower endoscopy (such as a colonoscopy or flexible sigmoidoscopy) you may notice spotting of blood in your stool or on the toilet paper. If you underwent a bowel prep for your procedure, you may not have a normal bowel movement for a few days.  Please Note:  You might notice some irritation and congestion in your nose or some drainage.  This is from the oxygen used during your procedure.  There is no need for concern and it should clear up in a day or so.  SYMPTOMS TO REPORT IMMEDIATELY:    Following upper endoscopy (EGD)  Vomiting of blood or coffee ground material  New chest pain or pain under the shoulder blades  Painful or persistently difficult swallowing  New shortness of breath  Fever of 100F or higher  Black, tarry-looking stools  For urgent or emergent issues, a gastroenterologist can be reached at any hour by calling [phone].   DIET: Your first meal following the procedure should be a small meal and then it is ok to progress to your normal diet. Heavy or fried foods are harder to digest and may make you feel nauseous or bloated.  Likewise, meals heavy in dairy and vegetables can increase bloating.  Drink plenty of fluids but you should avoid alcoholic beverages  for 24 hours.  ACTIVITY:  You should plan to take it easy for the rest of today and you should NOT DRIVE or use heavy machinery until tomorrow (because of the sedation medicines used during the test).    FOLLOW UP: Our staff will call the number listed on your records the next business day following your procedure to check on you and address any questions or concerns that you may have regarding the information given to you following your procedure. If we do not reach you, we will leave a message.  However, if you are feeling well and you are not experiencing any problems, there is no need to return our call.  We will assume that you have returned to your regular daily activities without incident.  If any biopsies were taken you will be contacted by phone or by letter within the next 1-3 weeks.  Please call us at [phone] if you have not heard about the biopsies in 3 weeks.    SIGNATURES/CONFIDENTIALITY: You and/or your care partner have signed paperwork which will be entered into your electronic medical record.  These signatures attest to the fact that that the information above on your After Visit Summary has been reviewed and is understood.  Full responsibility of the confidentiality of this discharge information lies with you and/or your care-partner.  Stay on your stomach medicine.  Read all of the handouts given to you by your recovery room nurse.

## 2014-08-22 NOTE — Op Note (Signed)
[place]  [employer] [place], [id number]   ENDOSCOPY PROCEDURE REPORT  PATIENT: Lindsay, Bass  MR#: [id number] BIRTHDATE: 04-Sep-1950 , 87  yrs. old GENDER: female ENDOSCOPIST: Milus Banister, MD REFERRED BY:  Harlan Stains, M.D. PROCEDURE DATE:  08/22/2014 PROCEDURE:  EGD w/ biopsy ASA CLASS:     Class II INDICATIONS:  Intermittent dysphagia. MEDICATIONS: Monitored anesthesia care and Propofol 290 mg IV TOPICAL ANESTHETIC: none  DESCRIPTION OF PROCEDURE: After the risks benefits and alternatives of the procedure were thoroughly explained, informed consent was obtained.  The LB [id number] endoscope was introduced through the mouth and advanced to the second portion of the duodenum , Without limitations.  The instrument was slowly withdrawn as the mucosa was fully examined.  There was mild, non-specific distal gastritis.  This was biopsied and sent to pathology.  There was a typical appearing 5-31mm pancreatic rest in the distal stomach, this was not biopsied.  The examination was otherwise normal.  The GE junction and esophagus were normal and given her dysphagia, I biospied the distal and proximal esophagus and sent to pathology.  Retroflexed views revealed no abnormalities.     The scope was then withdrawn from the patient and the procedure completed.  COMPLICATIONS: There were no immediate complications.  ENDOSCOPIC IMPRESSION: There was mild, non-specific distal gastritis.  This was biopsied and sent to pathology.  There was a typical appearing 5-38mm pancreatic rest in the distal stomach, this was not biopsied.  The examination was otherwise normal.  The GE junction and esophagus were normal and given her dysphagia, I biospied the distal and proximal esophagus and sent to pathology  RECOMMENDATIONS: Await final pathology.  Stay on once daily omeprazole for now.   eSigned:  Milus Banister, MD 08/22/2014 3:45 PM

## 2014-08-25 ENCOUNTER — Telehealth: Payer: Self-pay | Admitting: *Deleted

## 2014-08-25 NOTE — Telephone Encounter (Signed)
  Follow up Call-  Call back number 08/22/2014 04/06/2012  Post procedure Call Back phone  # [phone]  Permission to leave phone message Yes Yes     Patient questions:  Do you have a fever, pain , or abdominal swelling? No. Pain Score  0 *  Have you tolerated food without any problems? Yes.    Have you been able to return to your normal activities? Yes.    Do you have any questions about your discharge instructions: Diet   No. Medications  No. Follow up visit  No.  Do you have questions or concerns about your Care? No.  Actions: * If pain score is 4 or above: No action needed, pain <4.  Pt. At [facility] answered questions,stated she did fine.

## 2015-07-27 ENCOUNTER — Other Ambulatory Visit: Payer: Self-pay | Admitting: Family Medicine

## 2015-07-27 DIAGNOSIS — E049 Nontoxic goiter, unspecified: Secondary | ICD-10-CM

## 2015-07-31 ENCOUNTER — Other Ambulatory Visit: Payer: Self-pay

## 2015-07-31 ENCOUNTER — Ambulatory Visit
Admission: RE | Admit: 2015-07-31 | Discharge: 2015-07-31 | Disposition: A | Discharge status: Home / Self Care | Payer: BLUE CROSS/BLUE SHIELD | Source: Ambulatory Visit | Attending: Family Medicine | Admitting: Family Medicine

## 2015-07-31 DIAGNOSIS — E049 Nontoxic goiter, unspecified: Secondary | ICD-10-CM

## 2015-08-05 ENCOUNTER — Other Ambulatory Visit: Payer: Self-pay | Admitting: Family Medicine

## 2015-08-05 DIAGNOSIS — E041 Nontoxic single thyroid nodule: Secondary | ICD-10-CM

## 2015-08-19 ENCOUNTER — Other Ambulatory Visit (HOSPITAL_COMMUNITY)
Admission: RE | Admit: 2015-08-19 | Discharge: 2015-08-19 | Disposition: A | Discharge status: Home / Self Care | Payer: BLUE CROSS/BLUE SHIELD | Source: Ambulatory Visit | Attending: Family Medicine | Admitting: Family Medicine

## 2015-08-19 ENCOUNTER — Ambulatory Visit
Admission: RE | Admit: 2015-08-19 | Discharge: 2015-08-19 | Disposition: A | Discharge status: Home / Self Care | Payer: BLUE CROSS/BLUE SHIELD | Source: Ambulatory Visit | Attending: Family Medicine | Admitting: Family Medicine

## 2015-08-19 DIAGNOSIS — E041 Nontoxic single thyroid nodule: Secondary | ICD-10-CM

## 2016-01-20 ENCOUNTER — Telehealth: Payer: Self-pay | Admitting: Gastroenterology

## 2016-01-20 NOTE — Telephone Encounter (Signed)
I left a message to call back. Okay to schedule her with an APP. You can put per Dr Ardis Hughs. Thank you.

## 2016-01-20 NOTE — Telephone Encounter (Signed)
Next available rov is reasonable. She had EGD last year for mild dysphagia.  Make sure she takes all pills with a lot of water, can try apple sauce with pills as well. Thanks

## 2016-01-25 ENCOUNTER — Encounter: Payer: Self-pay | Admitting: Gastroenterology

## 2016-01-25 ENCOUNTER — Ambulatory Visit (INDEPENDENT_AMBULATORY_CARE_PROVIDER_SITE_OTHER): Payer: BLUE CROSS/BLUE SHIELD | Admitting: Gastroenterology

## 2016-01-25 VITALS — BP 110/80 | HR 72 | Ht 61.0 in | Wt 195.0 lb

## 2016-01-25 DIAGNOSIS — R1310 Dysphagia, unspecified: Secondary | ICD-10-CM

## 2016-01-25 NOTE — Progress Notes (Signed)
     01/25/2016 Lindsay Bass [id number] 01-Jul-1950   History of Present Illness:  This is a 65 year old female who is known to Dr. Ardis Hughs for complaints of issue swallowing. She had an EGD in March 2016 that showed mild nonspecific gastritis. Biopsies of the distal esophagus showed benign mucosa with minimal reactive changes and focal chronic inflammation.  Biopsies of the proximal esophagus were normal. Biopsies of stomach were negative for H. pylori but showed mild chronic inactive gastritis. He had mentioned that esophageal manometry may be the next step for evaluation in her case if she continued to have similar complaints. She presents back to our office today with complaints of issues swallowing her pills. She denies any issue swallowing solid food and actually has to use solid food to help push her pills down. She says that she feels like they get hung up in her chest and also at times she has some regurgitation or gurgling of the liquid in her esophagus when she drinks. She says that sometimes when she swallows she feels like her esophagus is tight.   Current Medications, Allergies, Past Medical History, Past Surgical History, Family History and Social History were reviewed in [employer] record.   Physical Exam: BP 110/80 (BP Location: Left Arm, Patient Position: Sitting, Cuff Size: Normal)   Pulse 72   Ht 5\' 1"  (1.549 m)   Wt 195 lb (88.5 kg) Comment: pt states she is wearing steel toe shoes  BMI 36.84 kg/m  General: Well developed female in no acute distress Head: Normocephalic and atraumatic Eyes:  Sclerae anicteric, conjunctiva pink  Ears: Normal auditory acuity Lungs: Clear throughout to auscultation Heart: Regular rate and rhythm Abdomen: Soft, non-distended.  Normal bowel sounds.  Non-tender. Musculoskeletal: Symmetrical with no gross deformities  Extremities: No edema  Neurological: Alert oriented x 4, grossly non-focal Psychological:  Alert  and cooperative. Normal mood and affect  Assessment and Recommendations: -Dysphagia:  Mostly to pills, but also has some regurgitation of liquid when drinking as well. EGD last year was unremarkable. Dr. Ardis Hughs said to consider manometry if issues continued. I think that that is probably the next reasonable step to take at this point. We will schedule her for esophageal manometry.

## 2016-01-25 NOTE — Patient Instructions (Signed)
You have been scheduled for an esophageal manometry at [facility] on 02-08-2016 at 12:30 PM. Please arrive at 12:15 PM to your procedure for registration. You will need to go to outpatient registration (1st floor of the hospital) first. Make certain to bring your insurance cards as well as a complete list of medications.  Please remember the following:  1) Nothing to eat or drink after 12:00 midnight on the night before your test.  2) Hold all diabetic medications/insulin the morning of the test. You may eat and take             your medications after the test.  3) For 3 days prior to your test do not take: Dexilant, Prevacid, Nexium, Protonix,         Aciphex, Zegerid, Pantoprazole, Prilosec or omeprazole.  4) For 2 days prior to your test, do not take: Reglan, Tagamet, Zantac, Axid or Pepcid.  5) You MAY use an antacid such as Rolaids or Tums up to 12 hours prior to your test.  It will take at least 2 weeks to receive the results of this test from your physician. ------------------------------------------ ABOUT ESOPHAGEAL MANOMETRY Esophageal manometry (muh-NOM-uh-tree) is a test that gauges how well your esophagus works. Your esophagus is the long, muscular tube that connects your throat to your stomach. Esophageal manometry measures the rhythmic muscle contractions (peristalsis) that occur in your esophagus when you swallow. Esophageal manometry also measures the coordination and force exerted by the muscles of your esophagus.  During esophageal manometry, a thin, flexible tube (catheter) that contains sensors is passed through your nose, down your esophagus and into your stomach. Esophageal manometry can be helpful in diagnosing some mostly uncommon disorders that affect your esophagus.  Why it's done Esophageal manometry is used to evaluate the movement (motility) of food through the esophagus and into the stomach. The test measures how well the circular bands of muscle  (sphincters) at the top and bottom of your esophagus open and close, as well as the pressure, strength and pattern of the wave of esophageal muscle contractions that moves food along.  What you can expect Esophageal manometry is an outpatient procedure done without sedation. Most people tolerate it well. You may be asked to change into a hospital gown before the test starts.  During esophageal manometry  While you are sitting up, a member of your health care team sprays your throat with a numbing medication or puts numbing gel in your nose or both.  A catheter is guided through your nose into your esophagus. The catheter may be sheathed in a water-filled sleeve. It doesn't interfere with your breathing. However, your eyes may water, and you may gag. You may have a slight nosebleed from irritation.  After the catheter is in place, you may be asked to lie on your back on an exam table, or you may be asked to remain seated.  You then swallow small sips of water. As you do, a computer connected to the catheter records the pressure, strength and pattern of your esophageal muscle contractions.  During the test, you'll be asked to breathe slowly and smoothly, remain as still as possible, and swallow only when you're asked to do so.  A member of your health care team may move the catheter down into your stomach while the catheter continues its measurements.  The catheter then is slowly withdrawn. The test usually lasts 20 to 30 minutes.  After esophageal manometry  When your esophageal manometry is complete,  you may return to your normal activities  This test typically takes 30-45 minutes to complete. ________________________________________________________________________________

## 2016-01-25 NOTE — Telephone Encounter (Signed)
Patient has been scheduled 8/14 with Alonza Bogus patient aware.

## 2016-01-26 NOTE — Progress Notes (Signed)
I agree with the above note, plan 

## 2016-02-08 ENCOUNTER — Ambulatory Visit (HOSPITAL_COMMUNITY)
Admission: RE | Admit: 2016-02-08 | Discharge: 2016-02-08 | Disposition: A | Discharge status: Home / Self Care | Payer: BLUE CROSS/BLUE SHIELD | Source: Ambulatory Visit | Attending: Gastroenterology | Admitting: Gastroenterology

## 2016-02-08 ENCOUNTER — Encounter (HOSPITAL_COMMUNITY)
Admission: RE | Disposition: A | Discharge status: Home / Self Care | Payer: Self-pay | Source: Ambulatory Visit | Attending: Gastroenterology

## 2016-02-08 DIAGNOSIS — R1310 Dysphagia, unspecified: Secondary | ICD-10-CM | POA: Diagnosis not present

## 2016-02-08 DIAGNOSIS — K449 Diaphragmatic hernia without obstruction or gangrene: Secondary | ICD-10-CM | POA: Diagnosis not present

## 2016-02-08 HISTORY — PX: ESOPHAGEAL MANOMETRY: SHX5429

## 2016-02-08 SURGERY — MANOMETRY, ESOPHAGUS

## 2016-02-08 MED ORDER — LIDOCAINE VISCOUS 2 % MT SOLN
OROMUCOSAL | Status: AC
  Filled 2016-02-08: qty 15

## 2016-02-08 SURGICAL SUPPLY — 2 items
FACESHIELD LNG OPTICON STERILE (SAFETY) IMPLANT
GLOVE BIO SURGEON STRL SZ8 (GLOVE) ×4 IMPLANT

## 2016-02-08 NOTE — Progress Notes (Signed)
Esophageal Manometry done per protocol. Pt tolerated well without complication. Report to be sent to Dr.Jacobs office.

## 2016-02-10 ENCOUNTER — Encounter (HOSPITAL_COMMUNITY): Payer: Self-pay | Admitting: Gastroenterology

## 2016-02-24 NOTE — Op Note (Signed)
Esophageal Manometry  Indications  Dysphagia   Interpretation / Findings  Normal resting EG junction pressure with complete relaxation after deglutition IRP 10.8 (Normal Integrated relaxation pressure <40mmHg) Esophageal contractions were 90% (9/10 swallows) failed peristalsis with only 1/10 swallow with intact peristaltic wave with normal distal latency and very low distal contractile integral Manometric evidence of hiatal hernia Normal basal and residual upper esophageal pressures Complete bolus clearance of 1/10 swallows  Impression Ineffective esophageal motility with poor bolus clearance Findings not consistent with achalasia May consider work up to exclude mixed connective tissue disease/scleroderma  Damaris Hippo, MD [facility]

## 2016-06-17 ENCOUNTER — Ambulatory Visit: Payer: Self-pay | Admitting: Gastroenterology

## 2016-08-11 ENCOUNTER — Other Ambulatory Visit: Payer: Self-pay | Admitting: Family Medicine

## 2016-08-11 DIAGNOSIS — R0989 Other specified symptoms and signs involving the circulatory and respiratory systems: Secondary | ICD-10-CM

## 2016-08-17 ENCOUNTER — Ambulatory Visit
Admission: RE | Admit: 2016-08-17 | Discharge: 2016-08-17 | Disposition: A | Discharge status: Home / Self Care | Payer: BLUE CROSS/BLUE SHIELD | Source: Ambulatory Visit | Attending: Family Medicine | Admitting: Family Medicine

## 2016-08-17 DIAGNOSIS — R0989 Other specified symptoms and signs involving the circulatory and respiratory systems: Secondary | ICD-10-CM

## 2016-08-22 ENCOUNTER — Other Ambulatory Visit: Payer: Self-pay | Admitting: Family Medicine

## 2016-08-22 DIAGNOSIS — E041 Nontoxic single thyroid nodule: Secondary | ICD-10-CM

## 2016-08-26 ENCOUNTER — Ambulatory Visit
Admission: RE | Admit: 2016-08-26 | Discharge: 2016-08-26 | Disposition: A | Discharge status: Home / Self Care | Payer: BLUE CROSS/BLUE SHIELD | Source: Ambulatory Visit | Attending: Family Medicine | Admitting: Family Medicine

## 2016-08-26 DIAGNOSIS — E041 Nontoxic single thyroid nodule: Secondary | ICD-10-CM

## 2017-06-27 DIAGNOSIS — H0589 Other disorders of orbit: Secondary | ICD-10-CM | POA: Insufficient documentation

## 2017-07-01 ENCOUNTER — Other Ambulatory Visit: Payer: Self-pay | Admitting: Otolaryngology

## 2017-07-01 DIAGNOSIS — H0589 Other disorders of orbit: Secondary | ICD-10-CM

## 2017-07-06 ENCOUNTER — Ambulatory Visit
Admission: RE | Admit: 2017-07-06 | Discharge: 2017-07-06 | Disposition: A | Discharge status: Home / Self Care | Payer: BLUE CROSS/BLUE SHIELD | Source: Ambulatory Visit | Attending: Otolaryngology | Admitting: Otolaryngology

## 2017-07-06 DIAGNOSIS — H0589 Other disorders of orbit: Secondary | ICD-10-CM

## 2017-07-06 MED ORDER — IOPAMIDOL (ISOVUE-300) INJECTION 61%
75.0000 mL | Freq: Once | INTRAVENOUS | Status: AC | PRN
  Administered 2017-07-06: 75 mL via INTRAVENOUS

## 2017-07-13 DIAGNOSIS — I10 Essential (primary) hypertension: Secondary | ICD-10-CM | POA: Insufficient documentation

## 2017-07-13 DIAGNOSIS — E7800 Pure hypercholesterolemia, unspecified: Secondary | ICD-10-CM | POA: Insufficient documentation

## 2017-08-26 IMAGING — US US SOFT TISSUE HEAD/NECK
1 series · 14 of 25 positions shown · non-contrast
Comparison: None.

CLINICAL DATA: Thyroid nodule suspected by physical exam

EXAM:
THYROID ULTRASOUND
TECHNIQUE: Ultrasound examination of the thyroid gland and adjacent soft
tissues was performed.

[Series 1: us soft tissue head/neck · 0.07mm/px · 14 of 59 slices shown]
[im 1/59]
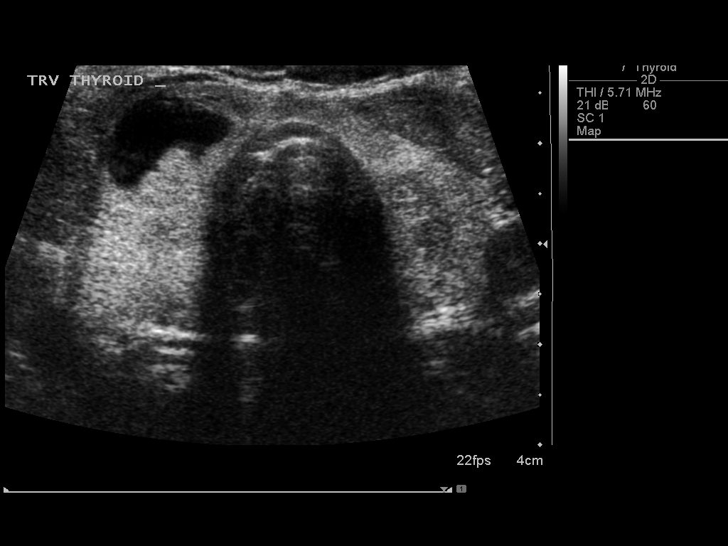
[im 5/59]
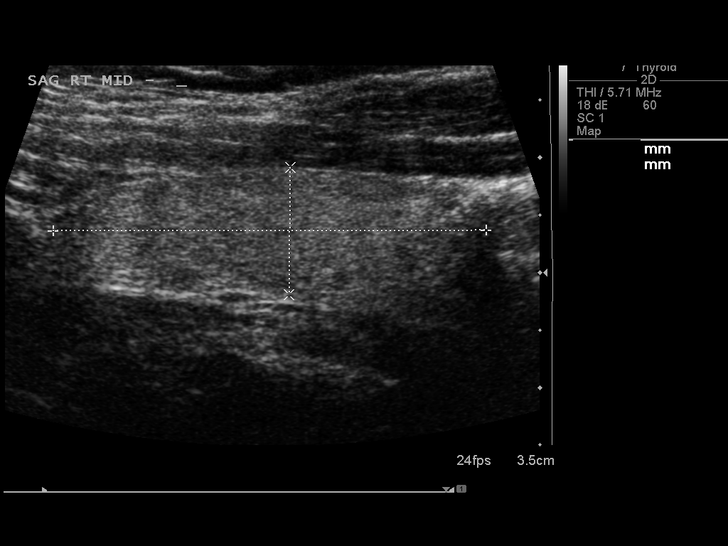
[im 10/59]
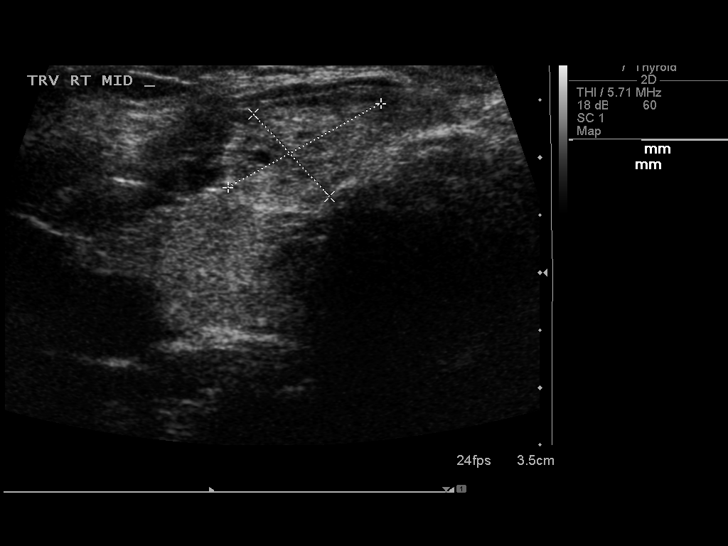
[im 15/59]
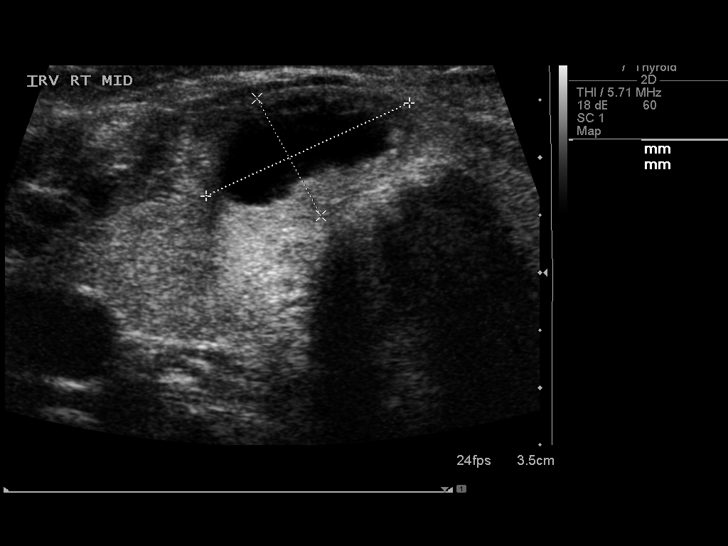
[im 20/59]
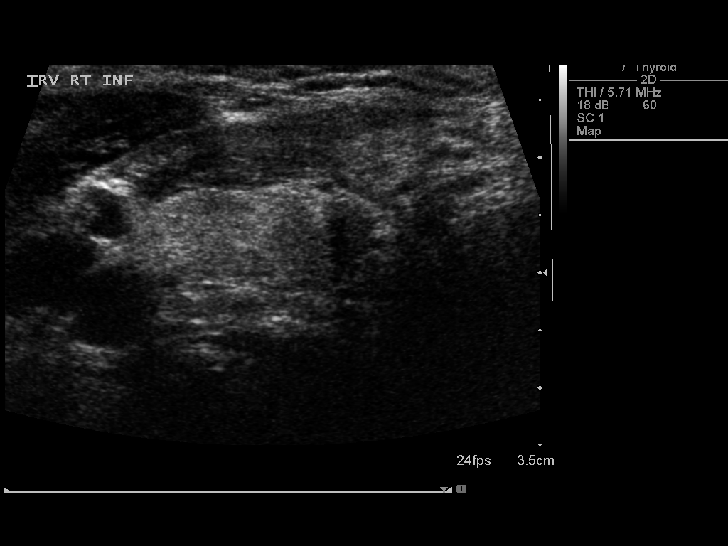
[im 22/59]
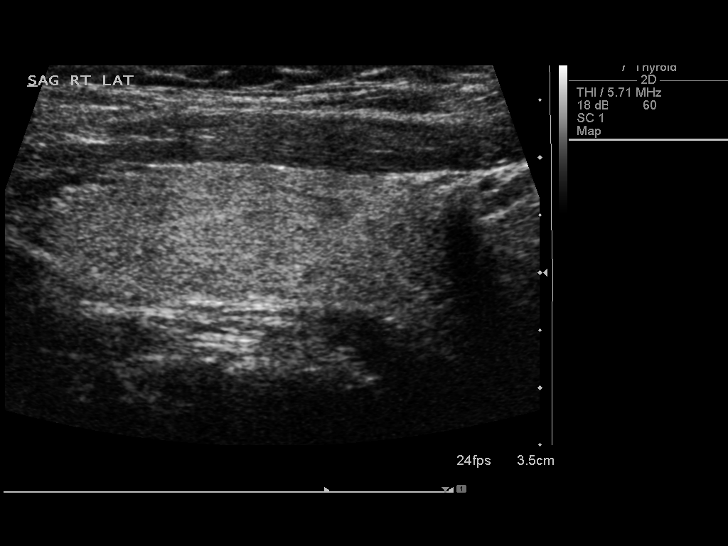
[im 27/59]
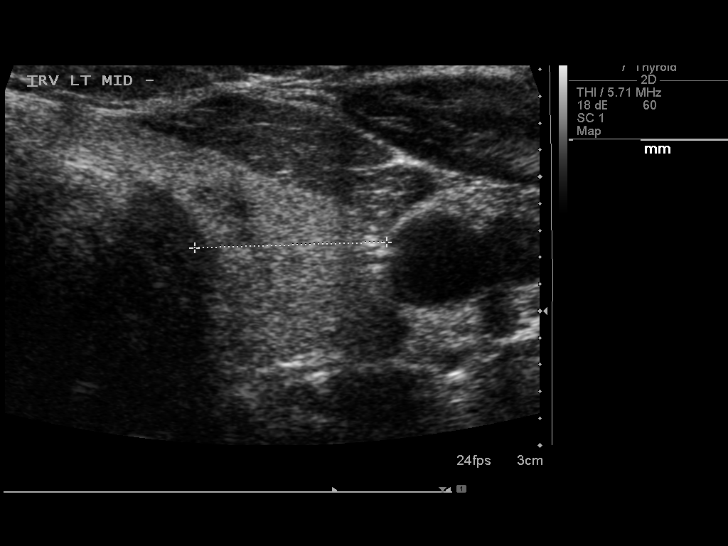
[im 32/59]
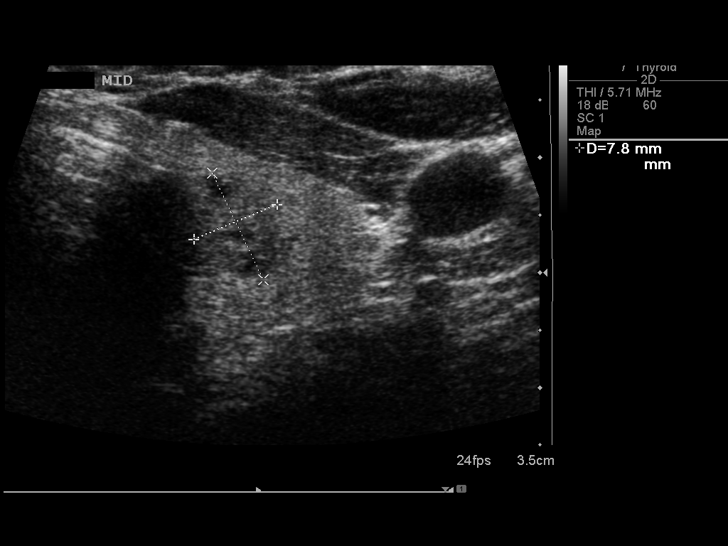
[im 37/59]
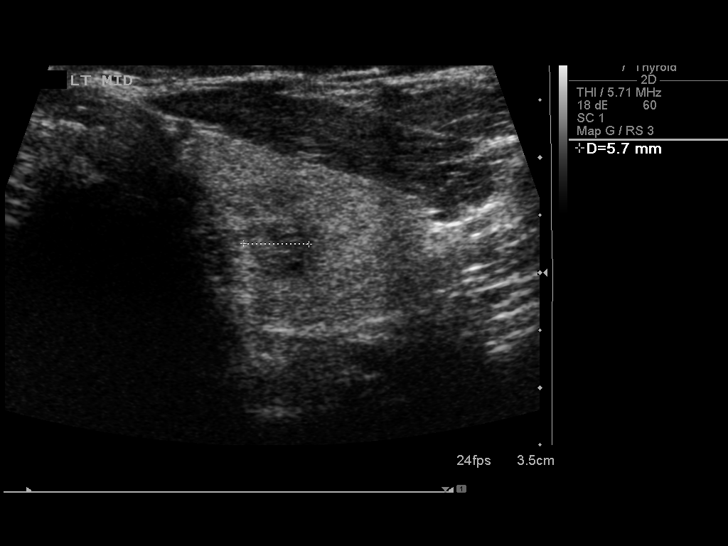
[im 39/59]
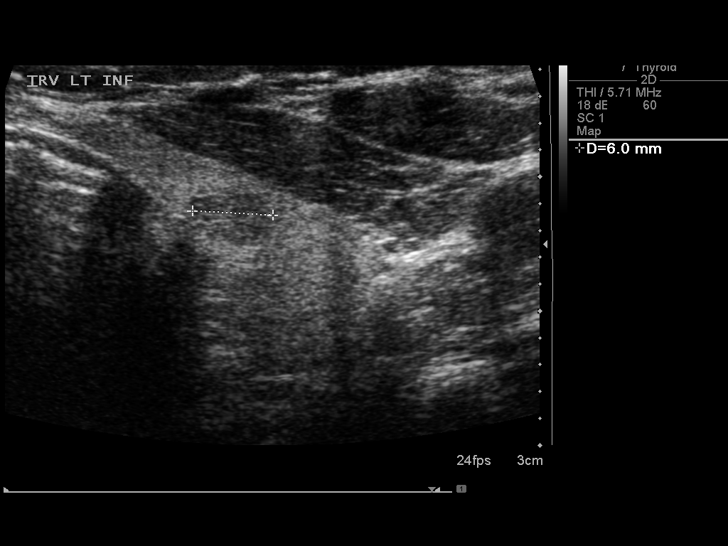
[im 44/59]
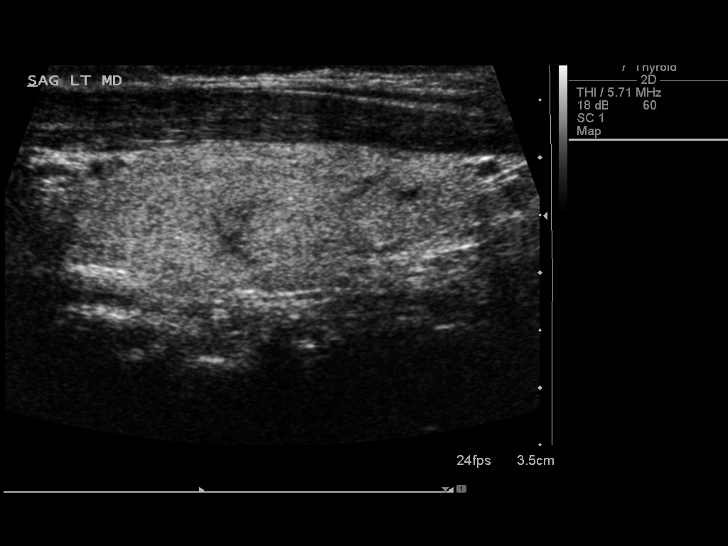
[im 49/59]
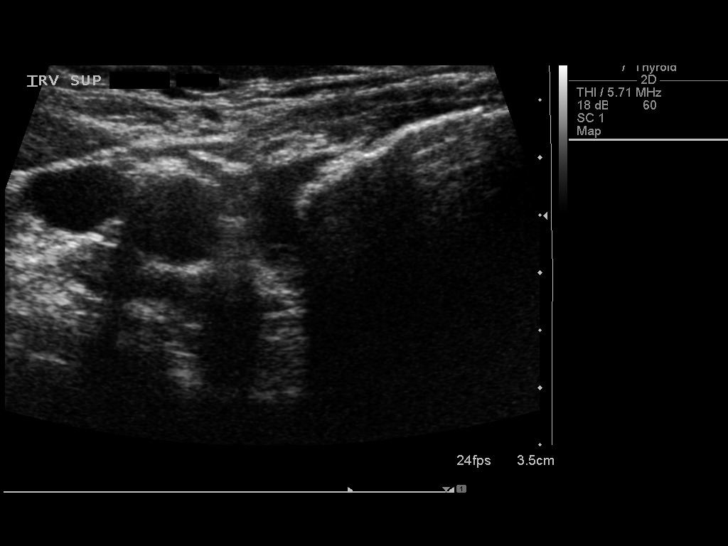
[im 54/59]
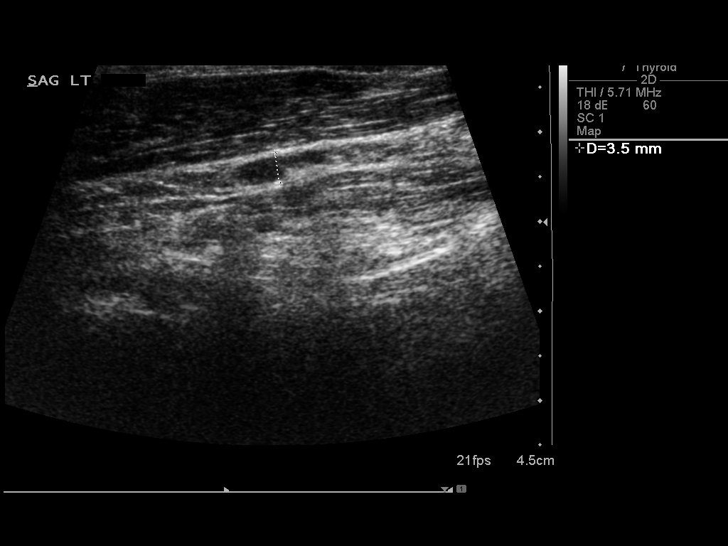
[im 59/59]
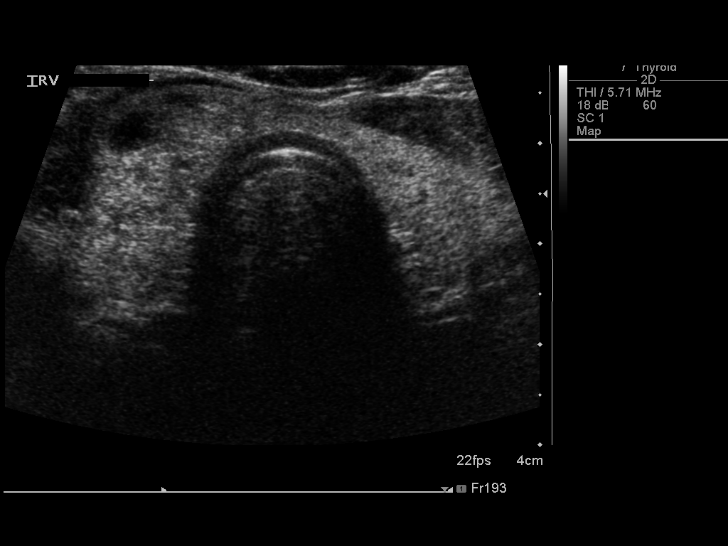

[14 of 25 positions shown; findings below may reference images not displayed]

FINDINGS: Right thyroid lobe

Measurements: 3.8 x 1.1 x 2.0 cm.  No nodules visualized.

Left thyroid lobe

Measurements: 4.3 x 1.5 x 1.4 cm. Solid 12 mm mid lobe nodule.
Adjacent 6 mm nodule. More inferior adjacent 6 mm nodule.

Isthmus

Thickness: 3 mm. Solid right-sided nodule measures 15 x 10 x 15 mm.
More inferior predominantly cystic right-sided nodule measures 20 x
23 x 16 mm.

Lymphadenopathy

None visualized.
IMPRESSION: There are left-sided and isthmic nodules as described. The solid 15
mm nodule in the isthmus meets criteria for fine needle aspiration
biopsy. Findings meet consensus criteria for biopsy.
Ultrasound-guided fine needle aspiration should be considered, as
per the consensus statement: Management of Thyroid Nodules Detected
at [place]: Society of Radiologists in Ultrasound Consensus Conference

## 2017-09-11 ENCOUNTER — Other Ambulatory Visit: Payer: Self-pay | Admitting: Ophthalmology

## 2017-12-11 ENCOUNTER — Ambulatory Visit
Admission: RE | Admit: 2017-12-11 | Discharge: 2017-12-11 | Disposition: A | Discharge status: Home / Self Care | Payer: BLUE CROSS/BLUE SHIELD | Source: Ambulatory Visit | Attending: Family Medicine | Admitting: Family Medicine

## 2017-12-11 ENCOUNTER — Other Ambulatory Visit: Payer: Self-pay | Admitting: Family Medicine

## 2017-12-11 DIAGNOSIS — R634 Abnormal weight loss: Secondary | ICD-10-CM

## 2017-12-11 DIAGNOSIS — M25562 Pain in left knee: Secondary | ICD-10-CM

## 2017-12-11 DIAGNOSIS — R700 Elevated erythrocyte sedimentation rate: Secondary | ICD-10-CM

## 2018-08-16 ENCOUNTER — Ambulatory Visit (INDEPENDENT_AMBULATORY_CARE_PROVIDER_SITE_OTHER): Payer: BLUE CROSS/BLUE SHIELD | Admitting: Allergy

## 2018-08-16 ENCOUNTER — Encounter: Payer: Self-pay | Admitting: Allergy

## 2018-08-16 VITALS — BP 112/74 | HR 74 | Resp 16 | Ht 60.5 in | Wt 169.0 lb

## 2018-08-16 DIAGNOSIS — L509 Urticaria, unspecified: Secondary | ICD-10-CM

## 2018-08-16 DIAGNOSIS — L299 Pruritus, unspecified: Secondary | ICD-10-CM | POA: Diagnosis not present

## 2018-08-16 NOTE — Progress Notes (Signed)
New Patient Note  RE: Lindsay Bass MRN: [id number] DOB: September 11, 1950 Date of Office Visit: 08/16/2018  Referring provider: Harlan Stains, MD Primary care provider: Harlan Stains, MD  Chief Complaint: Urticaria (had a flare up 3 weeks ago. still ongoing with the hives. seems to be no reason for them. they just pop up. )  History of Present Illness: I had the pleasure of seeing Lindsay Bass for initial evaluation at the [facility] on 08/17/2018. She is a 68 y.o. female, who is self-referred here for the evaluation of hives. Patient was seen by Dr. Neldon Mc in 2012 for hives.  Rash/hives started about 3 months ago. Mainly occurs on her torso, neck, arms. Describes them as pruritic, raise, red. Individual rashes lasts about a few hours. No ecchymosis upon resolution. Associated symptoms include: none. Suspected triggers are unsure. Denies any fevers, chills, changes in medications, foods, personal care products or recent infections. She has tried the following therapies: benadryl with some benefit. Systemic steroids none. Currently on benadryl prn.   Previous work up includes: saw Dr. Neldon Mc in 2012 and thought that the hives at that time were triggered by her dental infections. 2012 skin testing was positive to grass, weed, trees; negative to foods.  Previous history of rash/hives: in 2012 patient had hives for a few months. Patient is up to date with the following cancer screening tests: mammogram, colonoscopy, pap smears. Patient had extensive dental work in December 2019 before the hives and denies having any current dental infections. She does complain of some jaw pain that started in January but apparently facial Xray did not show any infection.  Denies any fevers or chills.   Assessment and Plan: Lindsay Bass is a 68 y.o. female with: Urticaria History of hives in 2012 which resolved after a few months. Thought to be triggered by her dental infection. Hives  dormant up until 3 months ago. Denies any triggers but she had extensive dental work in December 2019. Using benadryl with some benefit. Patient concerned about allergies.  Today's skin testing showed: Negative to environmental allergies and basic screening foods.   Concerned about this extensive dental work that may have triggered this episode of hives.  Get bloodwork as below to rule out any underlying etiologies.  Start zyrtec 10mg  once a day and may increase to twice a day as long as it does not cause drowsiness.  If not doing better in 2 weeks advised patient to call so we can adjust her medications.   Discussed proper skin care measures.   Pruritus See assessment and plan as above.  Return in about 4 weeks (around 09/13/2018).  No orders of the defined types were placed in this encounter.   Lab Orders     Protein Electrophoresis, Urine Rflx.     Alpha-Gal Panel     C3 and C4     CBC with Differential/Platelet     Comprehensive metabolic panel     Protein Electrophoresis, (serum)     Sedimentation rate     TSH     ANA     Protein electrophoresis, urine  Other allergy screening: Asthma: no Rhino conjunctivitis: no Food allergy: no Medication allergy: no Hymenoptera allergy: no Urticaria: yes Eczema:no History of recurrent infections suggestive of immunodeficency: no  Diagnostics: Skin Testing: Environmental allergy panel and select foods Negative test to: all as below.  Results discussed with patient/family. Airborne Adult Perc - 08/16/18 1517    Time Antigen Placed  [phone]  Allergen Manufacturer  Greer    Location  Back    Number of Test  59    Panel 1  Select    1. Control-Buffer 50% Glycerol  Negative    2. Control-Histamine 1 mg/ml  3+    3. Albumin saline  Negative    4. Luling  Negative    5. Guatemala  Negative    6. Johnson  Negative    7. Eaton Blue  Negative    8. Meadow Fescue  Negative    9. Perennial Rye  Negative    10. Sweet Vernal   Negative    11. Timothy  Negative    12. Cocklebur  Negative    13. Burweed Marshelder  Negative    14. Ragweed, short  Negative    15. Ragweed, Giant  Negative    16. Plantain,  English  Negative    17. Lamb's Quarters  Negative    18. Sheep Sorrell  Negative    19. Rough Pigweed  Negative    20. Marsh Elder, Rough  Negative    21. Mugwort, Common  Negative    22. Ash mix  Negative    23. Birch mix  Negative    24. Beech American  Negative    25. Box, Elder  Negative    26. Cedar, red  Negative    27. Cottonwood, Russian [place]  Negative    28. Elm mix  Negative    29. Hickory mix  Negative    30. Maple mix  Negative    31. Oak, Russian [place] mix  Negative    32. Pecan Pollen  Negative    33. Pine mix  Negative    34. Sycamore Eastern  Negative    35. Larch Way, Black Pollen  Negative    36. Alternaria alternata  Negative    37. Cladosporium Herbarum  Negative    38. Aspergillus mix  Negative    39. Penicillium mix  Negative    40. Bipolaris sorokiniana (Helminthosporium)  Negative    41. Drechslera spicifera (Curvularia)  Negative    42. Mucor plumbeus  Negative    43. Fusarium moniliforme  Negative    44. Aureobasidium pullulans (pullulara)  Negative    45. Rhizopus oryzae  Negative    46. Botrytis cinera  Negative    47. Epicoccum nigrum  Negative    48. Phoma betae  Negative    49. Candida Albicans  Negative    50. Trichophyton mentagrophytes  Negative    51. Mite, D Farinae  5,000 AU/ml  Negative    52. Mite, D Pteronyssinus  5,000 AU/ml  Negative    53. Cat Hair 10,000 BAU/ml  Negative    54.  Dog Epithelia  Negative    55. Mixed Feathers  Negative    56. Horse Epithelia  Negative    57. Cockroach, German  Negative    58. Mouse  Negative    59. Tobacco Leaf  Negative     Food Perc - 08/16/18 1517    Time Antigen Placed  0277    Allergen Manufacturer  Lavella Hammock    Location  Back    Number of allergen test  10    Food  Select    1. Peanut  Negative    2. Soybean food   Negative    3. Wheat, whole  Negative    4. Sesame  Negative    5. Milk, cow  Negative    6.  Egg White, chicken  Negative    7. Casein  Negative    8. Shellfish mix  Negative    9. Fish mix  Negative    10. Cashew  Negative       Past Medical History: Patient Active Problem List   Diagnosis Date Noted  . Urticaria 08/17/2018  . Pruritus 08/17/2018  . Dyspnea 06/09/2014  . Dysphagia 06/09/2014  . Chest pain 06/09/2014   Past Medical History:  Diagnosis Date  . Allergy   . Anemia   . Colon polyps 2013  . Fibromyalgia 2015  . High cholesterol   . Obesity 2005  . Sleep apnea    sometimes wear CPAP  . Urinary tract infection 2015  . Urticaria    Past Surgical History: Past Surgical History:  Procedure Laterality Date  . ABDOMINAL HYSTERECTOMY    . BREAST SURGERY     cyst removal, right  . COLONOSCOPY    . ESOPHAGEAL MANOMETRY N/A 02/08/2016   Procedure: ESOPHAGEAL MANOMETRY (EM);  Surgeon: Milus Banister, MD;  Location: [facility];  Service: Endoscopy;  Laterality: N/A;  . HERNIA MESH REMOVAL    . POLYPECTOMY     Medication List:  Current Outpatient Medications  Medication Sig Dispense Refill  . acetaminophen (TYLENOL) 500 MG tablet Take 500 mg by mouth daily as needed for moderate pain (pain).    Marland Kitchen atorvastatin (LIPITOR) 80 MG tablet Take 80 mg by mouth daily.    . Calcium Carbonate 500 MG CHEW Chew 2 tablets by mouth daily.    . cholecalciferol (VITAMIN D) 400 units TABS tablet Take 400 Units by mouth.    . ezetimibe (ZETIA) 10 MG tablet Take 10 mg by mouth daily.    Marland Kitchen losartan (COZAAR) 100 MG tablet Take 50 mg by mouth daily.  5  . vitamin B-12 (CYANOCOBALAMIN) 1000 MCG tablet Take 1,000 mcg by mouth daily.    Marland Kitchen VITAMIN E PO Take 1 tablet by mouth daily as needed (cold symptoms).    . zinc gluconate 50 MG tablet Take 50 mg by mouth daily as needed (cold symptoms).     No current facility-administered medications for this visit.    Allergies: No Known  Allergies Social History: Social History   Socioeconomic History  . Marital status: Married    Spouse name: Not on file  . Number of children: 2  . Years of education: Not on file  . Highest education level: Not on file  Occupational History  . Occupation: Geneticist, molecular  Social Needs  . Financial resource strain: Not on file  . Food insecurity:    Worry: Not on file    Inability: Not on file  . Transportation needs:    Medical: Not on file    Non-medical: Not on file  Tobacco Use  . Smoking status: Never Smoker  . Smokeless tobacco: Never Used  Substance and Sexual Activity  . Alcohol use: No    Alcohol/week: 0.0 standard drinks  . Drug use: No  . Sexual activity: Not on file  Lifestyle  . Physical activity:    Days per week: Not on file    Minutes per session: Not on file  . Stress: Not on file  Relationships  . Social connections:    Talks on phone: Not on file    Gets together: Not on file    Attends religious service: Not on file    Active member of club or organization: Not on file  Attends meetings of clubs or organizations: Not on file    Relationship status: Not on file  Other Topics Concern  . Not on file  Social History Narrative   Tobacco use cigarettes :Never smoked ,   Tobacco history last updated 07/15/2013.no smoking   Alcohol : Rare no Recreational drugs use. Exercise: very active at work ,no formal exercise. 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 Marital Status: Married Scientist, forensic. Children : Melanie ([place]) Monica (W/S), 3 steps Denman George 12 grands, 1 great grand. Religion: Love and Faith         Lives in a 66 year old home. Smoking: denies Occupation: Investment banker, corporate HistoryFreight forwarder in the house: no Carpet in the family room: yes Carpet in the bedroom: yes Heating: gas Cooling: central Pet: no  Family History: Family History  Problem Relation Age of Onset  . Alzheimer's disease Mother   .  Stroke Mother   . Heart attack Mother   . Heart disease Mother   . Breast cancer Maternal Grandmother   . Hypertension Maternal Grandmother   . Ovarian cancer Paternal Grandmother   . Heart attack Maternal Aunt   . Thyroid disease Daughter   . Colon cancer Neg Hx   . Esophageal cancer Neg Hx   . Stomach cancer Neg Hx   . Rectal cancer Neg Hx   . Diabetes Neg Hx   . Gallbladder disease Neg Hx    Problem                               Relation Asthma                                   No Eczema                                No Food allergy                          No Allergic rhino conjunctivitis     No Hives/angioedema  No  Review of Systems  Constitutional: Negative for appetite change, chills, fever and unexpected weight change.  HENT: Negative for congestion and rhinorrhea.   Eyes: Negative for itching.  Respiratory: Negative for cough, chest tightness, shortness of breath and wheezing.   Cardiovascular: Negative for chest pain.  Gastrointestinal: Negative for abdominal pain.  Genitourinary: Negative for difficulty urinating.  Skin: Positive for rash.  Allergic/Immunologic: Negative for environmental allergies and food allergies.  Neurological: Negative for headaches.   Objective: BP 112/74 (BP Location: Left Arm, Patient Position: Sitting, Cuff Size: Normal)   Pulse 74   Resp 16   Ht 5' 0.5" (1.537 m)   Wt 169 lb (76.7 kg)   SpO2 98%   BMI 32.46 kg/m  Body mass index is 32.46 kg/m. Physical Exam  Constitutional: She is oriented to person, place, and time. She appears well-developed and well-nourished.  Poor dentition  HENT:  Head: Normocephalic and atraumatic.  Right Ear: External ear normal.  Left Ear: External ear normal.  Nose: Nose normal.  Mouth/Throat: Oropharynx is clear and moist.  Eyes: Conjunctivae and EOM are normal.  Neck: Neck supple.  Cardiovascular: Normal rate, regular rhythm and normal heart sounds. Exam reveals no gallop and no friction rub.  No murmur heard. Pulmonary/Chest: Effort normal and breath sounds normal. She has no wheezes. She has no rales.  Abdominal: Soft.  Lymphadenopathy:    She has no cervical adenopathy.  Neurological: She is alert and oriented to person, place, and time.  Skin: Skin is warm. Rash noted.  Few urticarial lesions noted on forearm b/l  Psychiatric: She has a normal mood and affect. Her behavior is normal.  Nursing note and vitals reviewed.  The plan was reviewed with the patient/family, and all questions/concerned were addressed.  It was my pleasure to see Lindsay Bass today and participate in her care. Please feel free to contact me with any questions or concerns.  Sincerely,  Rexene Alberts, DO Allergy & Immunology  [facility] office: [phone] [facility] office: [phone]

## 2018-08-16 NOTE — Patient Instructions (Addendum)
Today's skin testing showed: Negative to environmental allergies and basic screening foods.   Start zyrtec 38m once a day and may increase to twice a day as long as it does not cause drowsiness. Let me know in 2 weeks if not doing better.   Get bloodwork - CBC diff, CMP, ESR, CRP, Thyroid cascade, ANA w/reflex, C3, C4, alpha gal. SPEP and UPEP  Follow up in 4 weeks.

## 2018-08-17 ENCOUNTER — Encounter: Payer: Self-pay | Admitting: Allergy

## 2018-08-17 DIAGNOSIS — L299 Pruritus, unspecified: Secondary | ICD-10-CM | POA: Insufficient documentation

## 2018-08-17 DIAGNOSIS — L509 Urticaria, unspecified: Secondary | ICD-10-CM | POA: Insufficient documentation

## 2018-08-17 NOTE — Assessment & Plan Note (Signed)
.   See assessment and plan as above. 

## 2018-08-17 NOTE — Assessment & Plan Note (Signed)
History of hives in 2012 which resolved after a few months. Thought to be triggered by her dental infection. Hives dormant up until 3 months ago. Denies any triggers but she had extensive dental work in December 2019. Using benadryl with some benefit. Patient concerned about allergies.  Today's skin testing showed: Negative to environmental allergies and basic screening foods.   Concerned about this extensive dental work that may have triggered this episode of hives.  Get bloodwork as below to rule out any underlying etiologies.  Start zyrtec 10mg  once a day and may increase to twice a day as long as it does not cause drowsiness.  If not doing better in 2 weeks advised patient to call so we can adjust her medications.   Discussed proper skin care measures.

## 2018-08-22 LAB — ALPHA-GAL PANEL
Beef IgE: <0.1 kU/L (ref <0.35)
CLASS: 0
Class: 0
Class: 0
LAMB/MUTTON IGE: <0.1 kU/L (ref <0.35)
Pork IgE: <0.1 kU/L (ref <0.35)

## 2018-08-22 LAB — CBC WITH DIFFERENTIAL/PLATELET
ABSOLUTE MONOCYTES: 495 {cells}/uL (ref 200–950)
Basophils Absolute: 22 cells/uL (ref 0–200)
Basophils Relative: 0.4 %
EOS ABS: 160 {cells}/uL (ref 15–500)
Eosinophils Relative: 2.9 %
HEMATOCRIT: 30.7 % — AB (ref 35.0–45.0)
HEMOGLOBIN: 10.1 g/dL — AB (ref 11.7–15.5)
Lymphs Abs: 2552 cells/uL (ref 850–3900)
MCH: 28.7 pg (ref 27.0–33.0)
MCHC: 32.9 g/dL (ref 32.0–36.0)
MCV: 87.2 fL (ref 80.0–100.0)
MPV: 12.4 fL (ref 7.5–12.5)
Monocytes Relative: 9 %
NEUTROS ABS: 2272 {cells}/uL (ref 1500–7800)
Neutrophils Relative %: 41.3 %
Platelets: 158 10*3/uL (ref 140–400)
RBC: 3.52 10*6/uL — ABNORMAL LOW (ref 3.80–5.10)
RDW: 11.9 % (ref 11.0–15.0)
Total Lymphocyte: 46.4 %
WBC: 5.5 10*3/uL (ref 3.8–10.8)

## 2018-08-22 LAB — COMPREHENSIVE METABOLIC PANEL
AG Ratio: 1.3 (calc) (ref 1.0–2.5)
ALKALINE PHOSPHATASE (APISO): 94 U/L (ref 37–153)
ALT: 10 U/L (ref 6–29)
AST: 16 U/L (ref 10–35)
Albumin: 3.6 g/dL (ref 3.6–5.1)
BILIRUBIN TOTAL: 0.3 mg/dL (ref 0.2–1.2)
BUN/Creatinine Ratio: 13 (calc) (ref 6–22)
BUN: 14 mg/dL (ref 7–25)
CO2: 29 mmol/L (ref 20–32)
Calcium: 8.8 mg/dL (ref 8.6–10.4)
Chloride: 104 mmol/L (ref 98–110)
Creat: 1.08 mg/dL — ABNORMAL HIGH (ref 0.50–0.99)
Globulin: 2.8 g/dL (calc) (ref 1.9–3.7)
Glucose, Bld: 102 mg/dL — ABNORMAL HIGH (ref 65–99)
Potassium: 3.8 mmol/L (ref 3.5–5.3)
Sodium: 140 mmol/L (ref 135–146)
Total Protein: 6.4 g/dL (ref 6.1–8.1)

## 2018-08-22 LAB — C3 AND C4
C3 Complement: 144 mg/dL (ref 83–193)
C4 COMPLEMENT: 50 mg/dL (ref 15–57)

## 2018-08-22 LAB — PROTEIN ELECTROPHORESIS, SERUM
ALPHA 1: 0.3 g/dL (ref 0.2–0.3)
ALPHA 2: 0.7 g/dL (ref 0.5–0.9)
Abnormal Protein Band1: 0.7 g/dL — ABNORMAL HIGH
Albumin ELP: 3.5 g/dL — ABNORMAL LOW (ref 3.8–4.8)
Beta 2: 0.6 g/dL — ABNORMAL HIGH (ref 0.2–0.5)
Beta Globulin: 0.4 g/dL (ref 0.4–0.6)
Gamma Globulin: 1.3 g/dL (ref 0.8–1.7)
Total Protein: 6.8 g/dL (ref 6.1–8.1)

## 2018-08-22 LAB — ANA: Anti Nuclear Antibody(ANA): NEGATIVE

## 2018-08-22 LAB — SEDIMENTATION RATE: SED RATE: 46 mm/h — AB (ref 0–30)

## 2018-08-22 LAB — TSH: TSH: 1.09 m[IU]/L (ref 0.40–4.50)

## 2018-08-29 ENCOUNTER — Encounter: Payer: Self-pay | Admitting: Allergy

## 2018-08-30 ENCOUNTER — Telehealth: Payer: Self-pay

## 2018-08-30 NOTE — Telephone Encounter (Signed)
Per Dr. Maudie Mercury Please place referral to heme/onc for abnormal SPEP. Thank you.

## 2018-09-13 ENCOUNTER — Ambulatory Visit: Payer: PRIVATE HEALTH INSURANCE | Admitting: Allergy

## 2018-09-17 ENCOUNTER — Emergency Department (HOSPITAL_COMMUNITY): Payer: BLUE CROSS/BLUE SHIELD

## 2018-09-17 ENCOUNTER — Encounter (HOSPITAL_COMMUNITY): Payer: Self-pay | Admitting: Pharmacy Technician

## 2018-09-17 ENCOUNTER — Other Ambulatory Visit: Payer: Self-pay

## 2018-09-17 ENCOUNTER — Emergency Department (HOSPITAL_COMMUNITY)
Admission: EM | Admit: 2018-09-17 | Discharge: 2018-09-17 | Disposition: A | Discharge status: Home / Self Care | Payer: BLUE CROSS/BLUE SHIELD | Attending: Emergency Medicine | Admitting: Emergency Medicine

## 2018-09-17 DIAGNOSIS — E7800 Pure hypercholesterolemia, unspecified: Secondary | ICD-10-CM | POA: Diagnosis not present

## 2018-09-17 DIAGNOSIS — R509 Fever, unspecified: Secondary | ICD-10-CM | POA: Diagnosis present

## 2018-09-17 DIAGNOSIS — Z79899 Other long term (current) drug therapy: Secondary | ICD-10-CM | POA: Diagnosis not present

## 2018-09-17 DIAGNOSIS — J189 Pneumonia, unspecified organism: Secondary | ICD-10-CM

## 2018-09-17 DIAGNOSIS — Z7189 Other specified counseling: Secondary | ICD-10-CM

## 2018-09-17 DIAGNOSIS — J181 Lobar pneumonia, unspecified organism: Secondary | ICD-10-CM | POA: Diagnosis not present

## 2018-09-17 LAB — COMPREHENSIVE METABOLIC PANEL
ALT: 14 U/L (ref 0–44)
AST: 23 U/L (ref 15–41)
Albumin: 3.4 g/dL — ABNORMAL LOW (ref 3.5–5.0)
Alkaline Phosphatase: 72 U/L (ref 38–126)
Anion gap: 8 (ref 5–15)
BUN: 8 mg/dL (ref 8–23)
CO2: 28 mmol/L (ref 22–32)
Calcium: 8.5 mg/dL — ABNORMAL LOW (ref 8.9–10.3)
Chloride: 99 mmol/L (ref 98–111)
Creatinine, Ser: 1.12 mg/dL — ABNORMAL HIGH (ref 0.44–1.00)
GFR calc Af Amer: 59 mL/min — ABNORMAL LOW (ref >60)
GFR calc non Af Amer: 51 mL/min — ABNORMAL LOW (ref >60)
Glucose, Bld: 93 mg/dL (ref 70–99)
Potassium: 3.5 mmol/L (ref 3.5–5.1)
Sodium: 135 mmol/L (ref 135–145)
Total Bilirubin: 0.8 mg/dL (ref 0.3–1.2)
Total Protein: 7.5 g/dL (ref 6.5–8.1)

## 2018-09-17 LAB — CBC WITH DIFFERENTIAL/PLATELET
Abs Immature Granulocytes: 0 10*3/uL (ref 0.00–0.07)
Basophils Absolute: 0 10*3/uL (ref 0.0–0.1)
Basophils Relative: 0 %
Eosinophils Absolute: 0 10*3/uL (ref 0.0–0.5)
Eosinophils Relative: 0 %
HCT: 34.4 % — ABNORMAL LOW (ref 36.0–46.0)
Hemoglobin: 10.4 g/dL — ABNORMAL LOW (ref 12.0–15.0)
Immature Granulocytes: 0 %
Lymphocytes Relative: 44 %
Lymphs Abs: 2 10*3/uL (ref 0.7–4.0)
MCH: 27.4 pg (ref 26.0–34.0)
MCHC: 30.2 g/dL (ref 30.0–36.0)
MCV: 90.8 fL (ref 80.0–100.0)
Monocytes Absolute: 0.7 10*3/uL (ref 0.1–1.0)
Monocytes Relative: 15 %
Neutro Abs: 1.9 10*3/uL (ref 1.7–7.7)
Neutrophils Relative %: 41 %
Platelets: 115 10*3/uL — ABNORMAL LOW (ref 150–400)
RBC: 3.79 MIL/uL — ABNORMAL LOW (ref 3.87–5.11)
RDW: 12.1 % (ref 11.5–15.5)
WBC: 4.5 10*3/uL (ref 4.0–10.5)
nRBC: 0 % (ref 0.0–0.2)

## 2018-09-17 LAB — LACTIC ACID, PLASMA: Lactic Acid, Venous: 0.8 mmol/L (ref 0.5–1.9)

## 2018-09-17 MED ORDER — ACETAMINOPHEN 500 MG PO TABS
1000.0000 mg | ORAL_TABLET | Freq: Once | ORAL | Status: AC
  Administered 2018-09-17: 19:00:00 1000 mg via ORAL
  Filled 2018-09-17: qty 2

## 2018-09-17 MED ORDER — AZITHROMYCIN 250 MG PO TABS: 250.0000 mg | ORAL_TABLET | Freq: Every day | ORAL | 0 refills | Status: DC

## 2018-09-17 NOTE — ED Provider Notes (Addendum)
[facility] EMERGENCY DEPARTMENT Provider Note   CSN: [id number] Arrival date & time: 09/17/18  1742    History   Chief Complaint Chief Complaint  Patient presents with  . Cough  . Fever    HPI Lindsay Bass is a 68 y.o. female.     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 Allergy   . Anemia   . Colon polyps 2013  . Fibromyalgia 2015  . High cholesterol   . Obesity 2005  . Sleep apnea    sometimes wear CPAP  . Urinary tract infection 2015  . Urticaria     Patient Active Problem List   Diagnosis Date Noted  . Urticaria 08/17/2018  . Pruritus 08/17/2018  . Dyspnea 06/09/2014  . Dysphagia 06/09/2014  . Chest pain 06/09/2014    Past Surgical History:  Procedure Laterality Date  . ABDOMINAL HYSTERECTOMY    . BREAST SURGERY     cyst removal, right  . COLONOSCOPY    . ESOPHAGEAL MANOMETRY N/A 02/08/2016   Procedure: ESOPHAGEAL MANOMETRY (EM);  Surgeon: Milus Banister, MD;  Location: [facility];  Service: Endoscopy;  Laterality: N/A;  . HERNIA MESH REMOVAL    . POLYPECTOMY       OB History   No obstetric history on file.      Home Medications    Prior to Admission medications   Medication Sig Start Date End Date Taking? Authorizing Provider  acetaminophen (TYLENOL) 500 MG tablet Take 500-1,000 mg by mouth every 6 (six) hours as needed (for pain or fever).    Yes [provider]  atorvastatin (LIPITOR) 80 MG tablet Take 80 mg by mouth daily.   Yes [provider]  Calcium Carbonate 500 MG  CHEW Chew 2 tablets by mouth daily.   Yes [provider]  DENTA 5000 PLUS 1.1 % CREA dental cream Place 1 application onto teeth See admin instructions. Brush as directed with dental cream 1-2 times a day and do not rinse, eat, or drink for 30 minutes afterwards 08/19/18  Yes [provider]  ezetimibe (ZETIA) 10 MG tablet Take 10 mg by mouth daily.   Yes [provider]  losartan-hydrochlorothiazide (HYZAAR) 50-12.5 MG tablet Take 1 tablet by mouth daily.   Yes [provider]  azithromycin (ZITHROMAX) 250 MG tablet Take 1 tablet (250 mg total) by mouth daily. Take first 2 tablets together, then 1 every day until finished. 09/17/18   Gareth Morgan, MD    Family History Family History  Problem Relation Age of Onset  . Alzheimer's disease Mother   . Stroke Mother   . Heart attack Mother   . Heart disease Mother   . Breast cancer Maternal Grandmother   . Hypertension Maternal Grandmother   . Ovarian cancer Paternal Grandmother   . Heart attack Maternal Aunt   . Thyroid disease Daughter   . Colon cancer Neg Hx   . Esophageal cancer Neg Hx   . Stomach cancer Neg Hx   . Rectal cancer  Neg Hx   . Diabetes Neg Hx   . Gallbladder disease Neg Hx     Social History Social History   Tobacco Use  . Smoking status: Never Smoker  . Smokeless tobacco: Never Used  Substance Use Topics  . Alcohol use: No    Alcohol/week: 0.0 standard drinks  . Drug use: No     Allergies   Patient has no known allergies.   Review of Systems Review of Systems  Constitutional: Positive for activity change, appetite change, fatigue and fever.  HENT: Positive for sore throat.   Eyes: Negative for visual disturbance.  Respiratory: Positive for cough and shortness of breath (mild).   Cardiovascular: Negative for chest pain.  Gastrointestinal: Negative for abdominal pain, nausea and vomiting.  Genitourinary: Negative for difficulty urinating and dysuria.   Musculoskeletal: Positive for myalgias. Negative for back pain and neck pain.  Skin: Negative for rash.  Neurological: Positive for headaches. Negative for syncope, weakness and numbness.     Physical Exam Updated Vital Signs BP 117/68   Pulse 77   Temp 98.8 F (37.1 C)   Resp 18   Ht 5\' 1"  (1.549 m)   Wt 72.6 kg   SpO2 98%   BMI 30.23 kg/m   Physical Exam Vitals signs and nursing note reviewed.  Constitutional:      General: She is not in acute distress.    Appearance: She is well-developed. She is not diaphoretic.  HENT:     Head: Normocephalic and atraumatic.  Eyes:     Conjunctiva/sclera: Conjunctivae normal.  Neck:     Musculoskeletal: Normal range of motion.  Cardiovascular:     Rate and Rhythm: Regular rhythm. Tachycardia present.     Heart sounds: Normal heart sounds. No murmur. No friction rub. No gallop.   Pulmonary:     Effort: Pulmonary effort is normal. No respiratory distress.     Breath sounds: Normal breath sounds. No wheezing or rales.  Abdominal:     General: There is no distension.     Palpations: Abdomen is soft.     Tenderness: There is no abdominal tenderness. There is no guarding.  Musculoskeletal:        General: No tenderness.  Skin:    General: Skin is warm and dry.     Findings: No erythema or rash.  Neurological:     Mental Status: She is alert and oriented to person, place, and time.      ED Treatments / Results  Labs (all labs ordered are listed, but only abnormal results are displayed) Labs Reviewed  CBC WITH DIFFERENTIAL/PLATELET - Abnormal; Notable for the following components:      Result Value   RBC 3.79 (*)    Hemoglobin 10.4 (*)    HCT 34.4 (*)    Platelets 115 (*)    All other components within normal limits  COMPREHENSIVE METABOLIC PANEL - Abnormal; Notable for the following components:   Creatinine, Ser 1.12 (*)    Calcium 8.5 (*)    Albumin 3.4 (*)    GFR calc non Af Amer 51 (*)    GFR calc Af Amer 59 (*)     All other components within normal limits  CULTURE, BLOOD (ROUTINE X 2)  CULTURE, BLOOD (ROUTINE X 2)  LACTIC ACID, PLASMA    EKG None  Radiology Dg Chest Portable 1 View  Result Date: 09/17/2018 CLINICAL DATA:  Cough and fever EXAM: PORTABLE CHEST 1 VIEW COMPARISON:  December 11, 2017 FINDINGS:  There is slight left base atelectasis. Lungs elsewhere are clear. Heart size and pulmonary vascularity are normal. No adenopathy. No bone lesions. IMPRESSION: Slight left base atelectasis. No edema or consolidation. Cardiac silhouette within normal limits. Electronically Signed   By: Lowella Grip III M.D.   On: 09/17/2018 19:10    Procedures Procedures (including critical care time)  Medications Ordered in ED Medications  acetaminophen (TYLENOL) tablet 1,000 mg (1,000 mg Oral Given 09/17/18 1925)     Initial Impression / Assessment and Plan / ED Course  I have reviewed the triage vital signs and the nursing notes.  Pertinent labs & imaging results that were available during my care of the patient were reviewed by me and considered in my medical decision making (see chart for details).        68 year old female with a history of hyperlipidemia and fibromyalgia presents with concern for cough and fever.  Mild tachycardia present with fever on arrival to the emergency department which has improved following Tylenol administration.  Lactic acid within normal limits.  Chest x-ray shows slight left base atelectasis versus infiltrate.  We will treat for community-acquired pneumonia with azithromycin.  Discussed possibility of COVID-19 infection, recommend self quarantine for 7 days, and 3 days after fever improvement.  Recommend azithromycin, continued supportive care and discussed reasons to return. Patient discharged in stable condition with understanding of reasons to return.    Helyn Schwan was evaluated in Emergency Department on 09/17/2018 for the symptoms described in the history of  present illness. He/she was evaluated in the context of the global COVID-19 pandemic, which necessitated consideration that the patient might be at risk for infection with the SARS-CoV-2 virus that causes COVID-19. Institutional protocols and algorithms that pertain to the evaluation of patients at risk for COVID-19 are in a state of rapid change based on information released by regulatory bodies including the CDC and federal and state organizations. These policies and algorithms were followed during the patient's care in the ED.   Final Clinical Impressions(s) / ED Diagnoses   Final diagnoses:  Community acquired pneumonia of left lower lobe of lung ([place])  Advice Given About Covid-19 Virus Infection    ED Discharge Orders         Ordered    azithromycin (ZITHROMAX) 250 MG tablet  Daily     09/17/18 2207           Gareth Morgan, MD 09/18/18 1884    Gareth Morgan, MD 09/18/18 1660

## 2018-09-17 NOTE — ED Triage Notes (Signed)
Pt arrives via EMS from home with reports of fever onset today. Pt also c/o generalized aches as well as headache and cough.

## 2018-09-18 NOTE — Telephone Encounter (Signed)
So sorry I am just now doing this. I think I clicked on it but never went back to it. Referral has been placed.

## 2018-09-19 NOTE — Telephone Encounter (Signed)
Patient is down for 5/14 @ 1. She has been informed.

## 2018-09-22 LAB — CULTURE, BLOOD (ROUTINE X 2)
Culture: NO GROWTH
Special Requests: ADEQUATE

## 2018-10-05 ENCOUNTER — Encounter: Payer: Self-pay | Admitting: Internal Medicine

## 2018-10-25 ENCOUNTER — Inpatient Hospital Stay: Payer: BC Managed Care – PPO | Attending: Internal Medicine | Admitting: Internal Medicine

## 2018-10-25 ENCOUNTER — Ambulatory Visit: Payer: BLUE CROSS/BLUE SHIELD

## 2018-10-25 ENCOUNTER — Inpatient Hospital Stay: Payer: BC Managed Care – PPO

## 2018-10-25 ENCOUNTER — Encounter: Payer: Self-pay | Admitting: Internal Medicine

## 2018-10-25 ENCOUNTER — Other Ambulatory Visit: Payer: Self-pay

## 2018-10-25 VITALS — BP 134/67 | HR 75 | Temp 98.2°F | Resp 18 | Ht 61.0 in | Wt 171.8 lb

## 2018-10-25 DIAGNOSIS — Z803 Family history of malignant neoplasm of breast: Secondary | ICD-10-CM | POA: Insufficient documentation

## 2018-10-25 DIAGNOSIS — G4730 Sleep apnea, unspecified: Secondary | ICD-10-CM | POA: Insufficient documentation

## 2018-10-25 DIAGNOSIS — M25559 Pain in unspecified hip: Secondary | ICD-10-CM

## 2018-10-25 DIAGNOSIS — D649 Anemia, unspecified: Secondary | ICD-10-CM | POA: Insufficient documentation

## 2018-10-25 DIAGNOSIS — M797 Fibromyalgia: Secondary | ICD-10-CM | POA: Insufficient documentation

## 2018-10-25 DIAGNOSIS — E669 Obesity, unspecified: Secondary | ICD-10-CM | POA: Insufficient documentation

## 2018-10-25 DIAGNOSIS — Z79899 Other long term (current) drug therapy: Secondary | ICD-10-CM | POA: Diagnosis not present

## 2018-10-25 DIAGNOSIS — D472 Monoclonal gammopathy: Secondary | ICD-10-CM | POA: Insufficient documentation

## 2018-10-25 DIAGNOSIS — E7800 Pure hypercholesterolemia, unspecified: Secondary | ICD-10-CM | POA: Insufficient documentation

## 2018-10-25 LAB — CMP (CANCER CENTER ONLY)
ALT: 18 U/L (ref 0–44)
AST: 21 U/L (ref 15–41)
Albumin: 3.2 g/dL — ABNORMAL LOW (ref 3.5–5.0)
Alkaline Phosphatase: 98 U/L (ref 38–126)
Anion gap: 6 (ref 5–15)
BUN: 9 mg/dL (ref 8–23)
CO2: 32 mmol/L (ref 22–32)
Calcium: 9.1 mg/dL (ref 8.9–10.3)
Chloride: 102 mmol/L (ref 98–111)
Creatinine: 0.97 mg/dL (ref 0.44–1.00)
GFR, Est AFR Am: >60 mL/min (ref >60)
GFR, Estimated: >60 mL/min (ref >60)
Glucose, Bld: 82 mg/dL (ref 70–99)
Potassium: 3.6 mmol/L (ref 3.5–5.1)
Sodium: 140 mmol/L (ref 135–145)
Total Bilirubin: 0.4 mg/dL (ref 0.3–1.2)
Total Protein: 7.2 g/dL (ref 6.5–8.1)

## 2018-10-25 LAB — CBC WITH DIFFERENTIAL (CANCER CENTER ONLY)
Abs Immature Granulocytes: 0 10*3/uL (ref 0.00–0.07)
Basophils Absolute: 0 10*3/uL (ref 0.0–0.1)
Basophils Relative: 1 %
Eosinophils Absolute: 0.2 10*3/uL (ref 0.0–0.5)
Eosinophils Relative: 3 %
HCT: 32 % — ABNORMAL LOW (ref 36.0–46.0)
Hemoglobin: 9.6 g/dL — ABNORMAL LOW (ref 12.0–15.0)
Immature Granulocytes: 0 %
Lymphocytes Relative: 51 %
Lymphs Abs: 2.7 10*3/uL (ref 0.7–4.0)
MCH: 27.7 pg (ref 26.0–34.0)
MCHC: 30 g/dL (ref 30.0–36.0)
MCV: 92.5 fL (ref 80.0–100.0)
Monocytes Absolute: 0.4 10*3/uL (ref 0.1–1.0)
Monocytes Relative: 8 %
Neutro Abs: 2 10*3/uL (ref 1.7–7.7)
Neutrophils Relative %: 37 %
Platelet Count: 148 10*3/uL — ABNORMAL LOW (ref 150–400)
RBC: 3.46 MIL/uL — ABNORMAL LOW (ref 3.87–5.11)
RDW: 13.3 % (ref 11.5–15.5)
WBC Count: 5.4 10*3/uL (ref 4.0–10.5)
nRBC: 0 % (ref 0.0–0.2)

## 2018-10-25 LAB — FERRITIN: Ferritin: 152 ng/mL (ref 11–307)

## 2018-10-25 LAB — SEDIMENTATION RATE: Sed Rate: 60 mm/hr — ABNORMAL HIGH (ref 0–22)

## 2018-10-25 LAB — LACTATE DEHYDROGENASE: LDH: 139 U/L (ref 98–192)

## 2018-10-25 NOTE — Progress Notes (Signed)
Referring Physician:  Dr. Rexene Alberts and Dr. Harlan Stains  Diagnosis Monoclonal gammopathy - Plan: CBC with Differential ([place] Only), CMP ([place] only), Lactate dehydrogenase (LDH), Sedimentation rate, Ferritin, SPEP with reflex to IFE, Kappa/lambda light chains, QIG  (Quant. immunoglobulins  - IgG, IgA, IgM), Rheumatoid factor, ANA, IFA (with reflex), DG Bone Survey Met  Pain in joint involving pelvic region and thigh, unspecified laterality - Plan: CBC with Differential ([place] Only), CMP ([place] only), Lactate dehydrogenase (LDH), Sedimentation rate, Ferritin, SPEP with reflex to IFE, Kappa/lambda light chains, QIG  (Quant. immunoglobulins  - IgG, IgA, IgM), Rheumatoid factor, ANA, IFA (with reflex), DG Bone Survey Met  Staging Cancer Staging No matching staging information was found for the patient.  Assessment and Plan:  1.  Monoclonal gammopathy.  68 year old female referred for evaluation due to monoclonal gammopathy.  Pt reports joint pain and is being followed by Dr. Maudie Mercury for periodic hives.  Recent SPEP done 08/17/2018 showed M spike of 0.7 g/dl.  Labs done 08/17/2018 showed WBC 5.5 HB 10.1 plts 158,000.  Chemistries WNL with K+ 3.8 Cr 1.08 and normal LFTs.  Pt has family history of breast cancer in maternal grandmother.  Pt is seen today for consultation due to monoclonal gammopathy.    Labs done today reviewed and showed WBC 5.4 HB 9.6 plts 148,000.  Chemistries showed K+ 3.6 Cr 0.97 and normal LFTs.  Awaiting results of SPEP with reflex to IFE, Kappa/lambda light chains, QIG  (Quant. immunoglobulins and skeletal survey.  Pt will have phone visit follow-up in 2 weeks to go over results.  I provided her written information regarding diagnosis of MGUS and role of monitoring with this condition if labs studies show no significant abnormalities.    2.  Anemia.  HB 9.6.  Awaiting SPEP.  Additional work-up may be recommended once labs reviewed.    3.  Joint pain.   Awaiting results of ANA, RF and skeletal survey.  Pt will have phone visit follow-up in 2 weeks to go over results.  Follow-up with PCP as directed.   4.  Urticaria.  Pt should follow-up with Dr. Maudie Mercury as directed.    5.  Family history of breast cancer.  Mammogram screening as recommended by PCP.    6.  Health maintenance.  Colonoscopy was done 04/06/2012 and was reported as normal.  GI follow-up as recommended.  Mammogram screening as recommended by PCP.    40 minutes spent with more than 50% spent in review of records, counseling and coordination of care.    HPI:  68 year old female referred for evaluation due to monoclonal gammopathy.  Pt reports joint pain and is being followed by Dr. Maudie Mercury for periodic hives.  Recent SPEP done 08/17/2018 showed M spike of 0.7 g/dl.  Labs done 08/17/2018 showed WBC 5.5 HB 10.1 plts 158,000.  Chemistries WNL with K+ 3.8 Cr 1.08 and normal LFTs.  Pt has family history of breast cancer in maternal grandmother.  Pt is seen today for consultation due to monoclonal gammopathy.    Problem List Patient Active Problem List   Diagnosis Date Noted  . Urticaria [L50.9] 08/17/2018  . Pruritus [L29.9] 08/17/2018  . Dyspnea [R06.00] 06/09/2014  . Dysphagia [R13.10] 06/09/2014  . Chest pain [R07.9] 06/09/2014    Past Medical History Past Medical History:  Diagnosis Date  . Allergy   . Anemia   . Colon polyps 2013  . Fibromyalgia 2015  . High cholesterol   .  Obesity 2005  . Sleep apnea    sometimes wear CPAP  . Urinary tract infection 2015  . Urticaria     Past Surgical History Past Surgical History:  Procedure Laterality Date  . ABDOMINAL HYSTERECTOMY    . BREAST SURGERY     cyst removal, right  . COLONOSCOPY    . ESOPHAGEAL MANOMETRY N/A 02/08/2016   Procedure: ESOPHAGEAL MANOMETRY (EM);  Surgeon: Milus Banister, MD;  Location: [facility];  Service: Endoscopy;  Laterality: N/A;  . HERNIA MESH REMOVAL    . POLYPECTOMY      Family History Family  History  Problem Relation Age of Onset  . Alzheimer's disease Mother   . Stroke Mother   . Heart attack Mother   . Heart disease Mother   . Breast cancer Maternal Grandmother   . Hypertension Maternal Grandmother   . Ovarian cancer Paternal Grandmother   . Heart attack Maternal Aunt   . Thyroid disease Daughter   . Colon cancer Neg Hx   . Esophageal cancer Neg Hx   . Stomach cancer Neg Hx   . Rectal cancer Neg Hx   . Diabetes Neg Hx   . Gallbladder disease Neg Hx      Social History  reports that she has never smoked. She has never used smokeless tobacco. She reports that she does not drink alcohol or use drugs.  Medications  Current Outpatient Medications:  .  acetaminophen (TYLENOL) 500 MG tablet, Take 500-1,000 mg by mouth every 6 (six) hours as needed (for pain or fever). , Disp: , Rfl:  .  atorvastatin (LIPITOR) 80 MG tablet, Take 80 mg by mouth daily., Disp: , Rfl:  .  Cholecalciferol (D3 HIGH POTENCY) 125 MCG (5000 UT) capsule, Take 5,000 Units by mouth daily., Disp: , Rfl:  .  DENTA 5000 PLUS 1.1 % CREA dental cream, Place 1 application onto teeth See admin instructions. Brush as directed with dental cream 1-2 times a day and do not rinse, eat, or drink for 30 minutes afterwards, Disp: , Rfl:  .  ezetimibe (ZETIA) 10 MG tablet, Take 10 mg by mouth daily., Disp: , Rfl:  .  fexofenadine (ALLEGRA) 180 MG tablet, Take 180 mg by mouth daily., Disp: , Rfl:  .  losartan-hydrochlorothiazide (HYZAAR) 50-12.5 MG tablet, Take 1 tablet by mouth daily., Disp: , Rfl:  .  azithromycin (ZITHROMAX) 250 MG tablet, Take 1 tablet (250 mg total) by mouth daily. Take first 2 tablets together, then 1 every day until finished. (Patient not taking: Reported on 10/25/2018), Disp: 6 tablet, Rfl: 0 .  Calcium Carbonate 500 MG CHEW, Chew 2 tablets by mouth daily., Disp: , Rfl:   Allergies Patient has no known allergies.  Review of Systems Review of Systems - Oncology ROS negative other than joint  pain and hives   Physical Exam  Vitals Wt Readings from Last 3 Encounters:  10/25/18 171 lb 12.8 oz (77.9 kg)  09/17/18 160 lb (72.6 kg)  08/16/18 169 lb (76.7 kg)   Temp Readings from Last 3 Encounters:  10/25/18 98.2 F (36.8 C) (Oral)  09/17/18 98.8 F (37.1 C)  08/22/14 97.3 F (36.3 C) (Tympanic)   BP Readings from Last 3 Encounters:  10/25/18 134/67  09/17/18 117/68  08/16/18 112/74   Pulse Readings from Last 3 Encounters:  10/25/18 75  09/17/18 77  08/16/18 74   Constitutional: Well-developed, well-nourished, and in no distress.   HENT: Head: Normocephalic and atraumatic.  Mouth/Throat: No oropharyngeal exudate. Mucosa  moist. Eyes: Pupils are equal, round, and reactive to light. Conjunctivae are normal. No scleral icterus.  Neck: Normal range of motion. Neck supple. No JVD present.  Cardiovascular: Normal rate, regular rhythm and normal heart sounds.  Exam reveals no gallop and no friction rub.   No murmur heard. Pulmonary/Chest: Effort normal and breath sounds normal. No respiratory distress. No wheezes.No rales.  Abdominal: Soft. Bowel sounds are normal. No distension. There is no tenderness. There is no guarding.  Musculoskeletal: No edema.  Arthritic changes noted in hands.   Lymphadenopathy: No cervical,axillary or supraclavicular adenopathy.  Neurological: Alert and oriented to person, place, and time. No cranial nerve deficit.  Skin: Skin is warm and dry. No rash noted. No erythema. No pallor.  Psychiatric: Affect and judgment normal.   Labs Appointment on 10/25/2018  Component Date Value Ref Range Status  . LDH 10/25/2018 139  98 - 192 U/L Final   Performed at [facility], [place] [address], [place], [place] [address]  . Sodium 10/25/2018 140  135 - 145 mmol/L Final  . Potassium 10/25/2018 3.6  3.5 - 5.1 mmol/L Final  . Chloride 10/25/2018 102  98 - 111 mmol/L Final  . CO2 10/25/2018 32  22 - 32 mmol/L Final  . Glucose, Bld  10/25/2018 82  70 - 99 mg/dL Final  . BUN 10/25/2018 9  8 - 23 mg/dL Final  . Creatinine 10/25/2018 0.97  0.44 - 1.00 mg/dL Final  . Calcium 10/25/2018 9.1  8.9 - 10.3 mg/dL Final  . Total Protein 10/25/2018 7.2  6.5 - 8.1 g/dL Final  . Albumin 10/25/2018 3.2* 3.5 - 5.0 g/dL Final  . AST 10/25/2018 21  15 - 41 U/L Final  . ALT 10/25/2018 18  0 - 44 U/L Final  . Alkaline Phosphatase 10/25/2018 98  38 - 126 U/L Final  . Total Bilirubin 10/25/2018 0.4  0.3 - 1.2 mg/dL Final  . GFR, Est Non Af Am 10/25/2018 >60  >60 mL/min Final  . GFR, Est AFR Am 10/25/2018 >60  >60 mL/min Final  . Anion gap 10/25/2018 6  5 - 15 Final   Performed at [facility], [place] [address], [place], [place] [address]  . WBC Count 10/25/2018 5.4  4.0 - 10.5 K/uL Final  . RBC 10/25/2018 3.46* 3.87 - 5.11 MIL/uL Final  . Hemoglobin 10/25/2018 9.6* 12.0 - 15.0 g/dL Final  . HCT 10/25/2018 32.0* 36.0 - 46.0 % Final  . MCV 10/25/2018 92.5  80.0 - 100.0 fL Final  . MCH 10/25/2018 27.7  26.0 - 34.0 pg Final  . MCHC 10/25/2018 30.0  30.0 - 36.0 g/dL Final  . RDW 10/25/2018 13.3  11.5 - 15.5 % Final  . Platelet Count 10/25/2018 148* 150 - 400 K/uL Final  . nRBC 10/25/2018 0.0  0.0 - 0.2 % Final  . Neutrophils Relative % 10/25/2018 37  % Final  . Neutro Abs 10/25/2018 2.0  1.7 - 7.7 K/uL Final  . Lymphocytes Relative 10/25/2018 51  % Final  . Lymphs Abs 10/25/2018 2.7  0.7 - 4.0 K/uL Final  . Monocytes Relative 10/25/2018 8  % Final  . Monocytes Absolute 10/25/2018 0.4  0.1 - 1.0 K/uL Final  . Eosinophils Relative 10/25/2018 3  % Final  . Eosinophils Absolute 10/25/2018 0.2  0.0 - 0.5 K/uL Final  . Basophils Relative 10/25/2018 1  % Final  . Basophils Absolute 10/25/2018 0.0  0.0 - 0.1 K/uL Final  . Immature  Granulocytes 10/25/2018 0  % Final  . Abs Immature Granulocytes 10/25/2018 0.00  0.00 - 0.07 K/uL Final   Performed at [facility], [place] [address], [place],  [place] [address]     Pathology Orders Placed This Encounter  Procedures  . DG Bone Survey Met    Standing Status:   Future    Standing Expiration Date:   12/25/2019    Order Specific Question:   Reason for Exam (SYMPTOM  OR DIAGNOSIS REQUIRED)    Answer:   monoclonal gammopathy    Order Specific Question:   Preferred imaging location?    Answer:   [facility]    Order Specific Question:   Radiology Contrast Protocol - do NOT remove file path    Answer:   \\[email or web address]  . CBC with Differential ([place] Only)    Standing Status:   Future    Number of Occurrences:   1    Standing Expiration Date:   10/25/2019  . CMP ([place] only)    Standing Status:   Future    Number of Occurrences:   1    Standing Expiration Date:   10/25/2019  . Lactate dehydrogenase (LDH)    Standing Status:   Future    Number of Occurrences:   1    Standing Expiration Date:   10/25/2019  . Sedimentation rate    Standing Status:   Future    Number of Occurrences:   1    Standing Expiration Date:   10/25/2019  . Ferritin    Standing Status:   Future    Number of Occurrences:   1    Standing Expiration Date:   10/25/2019  . SPEP with reflex to IFE    Standing Status:   Future    Number of Occurrences:   1    Standing Expiration Date:   10/25/2019  . Kappa/lambda light chains    Standing Status:   Future    Number of Occurrences:   1    Standing Expiration Date:   10/25/2019  . QIG  (Quant. immunoglobulins  - IgG, IgA, IgM)    Standing Status:   Future    Number of Occurrences:   1    Standing Expiration Date:   10/25/2019  . Rheumatoid factor    Standing Status:   Future    Number of Occurrences:   1    Standing Expiration Date:   10/25/2019  . ANA, IFA (with reflex)    Standing Status:   Future    Number of Occurrences:   1    Standing Expiration Date:   10/25/2019       Zoila Shutter MD

## 2018-10-26 ENCOUNTER — Telehealth: Payer: Self-pay | Admitting: Internal Medicine

## 2018-10-26 LAB — IGG, IGA, IGM
IgA: 1190 mg/dL — ABNORMAL HIGH (ref 87–352)
IgG (Immunoglobin G), Serum: 772 mg/dL (ref 586–1602)
IgM (Immunoglobulin M), Srm: 151 mg/dL (ref 26–217)

## 2018-10-26 LAB — KAPPA/LAMBDA LIGHT CHAINS
Kappa free light chain: 56.6 mg/L — ABNORMAL HIGH (ref 3.3–19.4)
Kappa, lambda light chain ratio: 4.76 — ABNORMAL HIGH (ref 0.26–1.65)
Lambda free light chains: 11.9 mg/L (ref 5.7–26.3)

## 2018-10-26 LAB — ANTINUCLEAR ANTIBODIES, IFA: ANA Ab, IFA: NEGATIVE

## 2018-10-26 LAB — RHEUMATOID FACTOR: Rheumatoid fact SerPl-aCnc: 19.5 IU/mL — ABNORMAL HIGH (ref 0.0–13.9)

## 2018-10-26 NOTE — Telephone Encounter (Signed)
Tried to reach regarding schedule °

## 2018-10-29 LAB — PROTEIN ELECTROPHORESIS, SERUM, WITH REFLEX
A/G Ratio: 1 (ref 0.7–1.7)
Albumin ELP: 3.3 g/dL (ref 2.9–4.4)
Alpha-1-Globulin: 0.2 g/dL (ref 0.0–0.4)
Alpha-2-Globulin: 0.7 g/dL (ref 0.4–1.0)
Beta Globulin: 1.1 g/dL (ref 0.7–1.3)
Gamma Globulin: 1.4 g/dL (ref 0.4–1.8)
Globulin, Total: 3.4 g/dL (ref 2.2–3.9)
M-Spike, %: 0.5 g/dL — ABNORMAL HIGH
SPEP Interpretation: 0
Total Protein ELP: 6.7 g/dL (ref 6.0–8.5)

## 2018-10-29 LAB — IMMUNOFIXATION REFLEX, SERUM
IgA: 1198 mg/dL — ABNORMAL HIGH (ref 87–352)
IgG (Immunoglobin G), Serum: 796 mg/dL (ref 586–1602)
IgM (Immunoglobulin M), Srm: 161 mg/dL (ref 26–217)

## 2018-11-01 ENCOUNTER — Other Ambulatory Visit: Payer: Self-pay

## 2018-11-01 ENCOUNTER — Ambulatory Visit (HOSPITAL_COMMUNITY)
Admission: RE | Admit: 2018-11-01 | Discharge: 2018-11-01 | Disposition: A | Discharge status: Home / Self Care | Payer: BLUE CROSS/BLUE SHIELD | Source: Ambulatory Visit | Attending: Internal Medicine | Admitting: Internal Medicine

## 2018-11-01 DIAGNOSIS — D472 Monoclonal gammopathy: Secondary | ICD-10-CM | POA: Diagnosis present

## 2018-11-01 DIAGNOSIS — M25559 Pain in unspecified hip: Secondary | ICD-10-CM | POA: Insufficient documentation

## 2018-11-07 ENCOUNTER — Ambulatory Visit: Payer: BLUE CROSS/BLUE SHIELD | Admitting: Allergy

## 2018-11-09 ENCOUNTER — Inpatient Hospital Stay (HOSPITAL_BASED_OUTPATIENT_CLINIC_OR_DEPARTMENT_OTHER): Payer: BC Managed Care – PPO | Admitting: Internal Medicine

## 2018-11-09 DIAGNOSIS — G4730 Sleep apnea, unspecified: Secondary | ICD-10-CM

## 2018-11-09 DIAGNOSIS — E669 Obesity, unspecified: Secondary | ICD-10-CM | POA: Diagnosis not present

## 2018-11-09 DIAGNOSIS — D472 Monoclonal gammopathy: Secondary | ICD-10-CM | POA: Diagnosis not present

## 2018-11-09 DIAGNOSIS — D649 Anemia, unspecified: Secondary | ICD-10-CM

## 2018-11-09 DIAGNOSIS — E7800 Pure hypercholesterolemia, unspecified: Secondary | ICD-10-CM

## 2018-11-09 DIAGNOSIS — M797 Fibromyalgia: Secondary | ICD-10-CM

## 2018-11-09 DIAGNOSIS — Z803 Family history of malignant neoplasm of breast: Secondary | ICD-10-CM

## 2018-11-09 DIAGNOSIS — Z79899 Other long term (current) drug therapy: Secondary | ICD-10-CM

## 2018-11-09 NOTE — Progress Notes (Signed)
Virtual Visit via Telephone Note  I connected with Lindsay Bass on 11/09/18 at  2:20 PM EDT by telephone and verified that I am speaking with the correct person using two identifiers.   I discussed the limitations, risks, security and privacy concerns of performing an evaluation and management service by telephone and the availability of in person appointments. I also discussed with the patient that there may be a patient responsible charge related to this service. The patient expressed understanding and agreed to proceed.  Interval history:  Historical data obtained from note dated 10/25/2018.  68 year old female referred for evaluation due to monoclonal gammopathy.  Pt reports joint pain and is being followed by Dr. Maudie Mercury for periodic hives.  Recent SPEP done 08/17/2018 showed M spike of 0.7 g/dl.  Labs done 08/17/2018 showed WBC 5.5 HB 10.1 plts 158,000.  Chemistries WNL with K+ 3.8 Cr 1.08 and normal LFTs.  Pt has family history of breast cancer in maternal grandmother.  Pt is seen today for consultation due to monoclonal gammopathy.    Observations/Objective: Review of labs and xrays done 10/2018.    Assessment and Plan: 1.  Monoclonal gammopathy.  68 year old female referred for evaluation due to monoclonal gammopathy.  Pt reports joint pain and is being followed by Dr. Maudie Mercury for periodic hives.  Recent SPEP done 08/17/2018 showed M spike of 0.7 g/dl.  Labs done 08/17/2018 showed WBC 5.5 HB 10.1 plts 158,000.  Chemistries WNL with K+ 3.8 Cr 1.08 and normal LFTs.  Pt has family history of breast cancer in maternal grandmother.   Labs done 10/25/2018 reviewed and showed WBC 5.4 HB 9.6 plts 148,000.  Chemistries showed K+ 3.6 Cr 0.97 and normal LFTs.  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  She has elevated FLC ratio of 4.76.  Quant IG show elevated IGA of 1198.  Skeletal survey done 11/01/2018 shows no lytic bone lesions.     Due to elevated IGA level, I discussed with her need for further work-up. Increased levels of IgA are seen in several inflammatory disorders, including IgA nephropathy, vasculitis, AIDS, alcoholic cirrhosis, advanced hepatitis, IgA myeloma, and several autoimmune diseases such as rheumatoid arthritis and lupus.  Pt has negative ANA but mildly elevated RF of 19.  Pt reports she was told in the past she has rheumatoid arthritis.  Pt will have lab done if 1 week to check for HIV, Hepatitis, Uric acid, and 24 hour UPEP for Protein and light chains and IFE.  She will be notified of results and any recommendations for further work-up.  All questions answered and she expressed understanding of information presented.  Currently she has normal renal function and calcium level and no bone lesions.    2.  Anemia.  HB 9.6.  SPEP shows m spike measuring 0.5 g/dl.  Additional labs ordered.  Pt will be notified of results.   3.  Joint pain.  Pt has negative ANA and skeletal survey.  RF is elevated at 19.  Pt reportedly told she has RA.   Follow-up with PCP as directed.   4.  Urticaria.  Pt should follow-up with Dr. Maudie Mercury as directed.    5.  Family history of breast cancer.  Mammogram screening as recommended by PCP.    6.  Health maintenance.  Colonoscopy was done 04/06/2012 and was reported as normal.  GI follow-up as recommended.  Mammogram screening as recommended by PCP.  Follow Up Instructions: Labs and 24 hour urine testing in 1 week.    I discussed the assessment and treatment plan with the patient. The patient was provided an opportunity to ask questions and all were answered. The patient agreed with the plan and demonstrated an understanding of the instructions.   The patient was advised to call back or seek an in-person evaluation if the symptoms worsen or if the condition fails to improve as anticipated.  I provided 15 minutes of non-face-to-face time during this encounter.   Zoila Shutter,  MD

## 2018-11-12 ENCOUNTER — Telehealth: Payer: Self-pay | Admitting: Internal Medicine

## 2018-11-12 NOTE — Telephone Encounter (Signed)
Tried to reach regarding schedule °

## 2018-11-14 ENCOUNTER — Ambulatory Visit (INDEPENDENT_AMBULATORY_CARE_PROVIDER_SITE_OTHER): Payer: BLUE CROSS/BLUE SHIELD | Admitting: Allergy

## 2018-11-14 ENCOUNTER — Other Ambulatory Visit: Payer: Self-pay

## 2018-11-14 ENCOUNTER — Encounter: Payer: Self-pay | Admitting: Allergy

## 2018-11-14 DIAGNOSIS — L509 Urticaria, unspecified: Secondary | ICD-10-CM

## 2018-11-14 NOTE — Progress Notes (Signed)
RE: Lindsay Bass MRN: [id number] DOB: [birth year] Date of Telemedicine Visit: 11/14/2018  Referring provider: Harlan Stains, MD Primary care provider: Harlan Stains, MD  Chief Complaint: Urticaria (comes back every once in a while)   Telemedicine Follow Up Visit via Telephone: I connected with Lindsay Bass for a follow up on 11/14/18 by telephone and verified that I am speaking with the correct person using two identifiers.   I discussed the limitations, risks, security and privacy concerns of performing an evaluation and management service by telephone and the availability of in person appointments. I also discussed with the patient that there may be a patient responsible charge related to this service. The patient expressed understanding and agreed to proceed.  Patient is at work.  Provider is at the office.  Visit start time: 3:22PM Visit end time: 3:44PM Insurance consent/check in by: front desk. Medical consent and medical assistant/nurse: Zerita Boers.  History of Present Illness: She is a 68 y.o. female, who is being followed for urticaria. Her previous allergy office visit was on 08/16/2018 with Dr. Maudie Mercury. Today is a regular follow up visit.  Urticaria The hives are about 60% improved with taking allegra 180mg  daily. Has not tried zyrtec or tried to take allegra BID. She has some weeks where the hives appear for a few days and sometimes can go on for 1-2 weeks with no hives. When they do appear it does not lasts more than 1 hour.   Saw heme/onc and currently being worked up for the gammopathy and is supposed to see rheumatology as well.   Assessment and Plan: Lindsay Bass is a 68 y.o. female with: Urticaria Past history - History of hives in 2012 which resolved after a few months. Thought to be triggered by her dental infection. Hives dormant up until 3 months ago. Denies any triggers but she had extensive dental work in December 2019. 2020 skin testing showed: Negative to  environmental allergies and basic screening foods.  Interim history - 60% improved with daily allegra. Did not try zyrtec or BID dosing. Being followed by heme/onc for monoclonal gammopathy.  Continue allegra 180mg  once a day and may increase to twice a day as long as it does not cause drowsiness.  May try zyrtec 10mg  1-2 times a day instead of allegra.   If not doing better in 2 weeks advised patient to call so we can adjust her medications.   Return in about 4 months (around 03/16/2019).  Diagnostics: None.  Medication List:  Current Outpatient Medications  Medication Sig Dispense Refill  . acetaminophen (TYLENOL) 500 MG tablet Take 500-1,000 mg by mouth every 6 (six) hours as needed (for pain or fever).     Marland Kitchen atorvastatin (LIPITOR) 80 MG tablet Take 80 mg by mouth daily.    . Calcium Carbonate 500 MG CHEW Chew 2 tablets by mouth daily.    . Cholecalciferol (D3 HIGH POTENCY) 125 MCG (5000 UT) capsule Take 5,000 Units by mouth daily.    . DENTA 5000 PLUS 1.1 % CREA dental cream Place 1 application onto teeth See admin instructions. Brush as directed with dental cream 1-2 times a day and do not rinse, eat, or drink for 30 minutes afterwards    . ezetimibe (ZETIA) 10 MG tablet Take 10 mg by mouth daily.    . fexofenadine (ALLEGRA) 180 MG tablet Take 180 mg by mouth daily.    Marland Kitchen losartan-hydrochlorothiazide (HYZAAR) 50-12.5 MG tablet Take 1 tablet by mouth daily.     No  current facility-administered medications for this visit.    Allergies: No Known Allergies I reviewed her past medical history, social history, family history, and environmental history and no significant changes have been reported from previous visit on 08/16/2018.  Review of Systems  Constitutional: Negative for appetite change, chills, fever and unexpected weight change.  HENT: Negative for congestion and rhinorrhea.   Eyes: Negative for itching.  Respiratory: Negative for cough, chest tightness, shortness of breath  and wheezing.   Cardiovascular: Negative for chest pain.  Gastrointestinal: Negative for abdominal pain.  Genitourinary: Negative for difficulty urinating.  Skin: Positive for rash.  Allergic/Immunologic: Negative for environmental allergies and food allergies.  Neurological: Negative for headaches.   Objective: Physical Exam Not obtained as encounter was done via telephone.   Previous notes and tests were reviewed.  I discussed the assessment and treatment plan with the patient. The patient was provided an opportunity to ask questions and all were answered. The patient agreed with the plan and demonstrated an understanding of the instructions. After visit summary/patient instructions available via mychart.   The patient was advised to call back or seek an in-person evaluation if the symptoms worsen or if the condition fails to improve as anticipated.  I provided 22 minutes of non-face-to-face time during this encounter.  It was my pleasure to participate in Lindsay Bass care today. Please feel free to contact me with any questions or concerns.   Sincerely,  Rexene Alberts, DO Allergy & Immunology  [facility] office: [phone] [facility] office: [phone]

## 2018-11-14 NOTE — Patient Instructions (Addendum)
Urticaria  Continue zyrtec 10mg  once a day and may increase to twice a day as long as it does not cause drowsiness.  If not doing better in 2 weeks advised patient to call so we can adjust her medications.   Discussed proper skin care measures.    Follow up in 4 months or sooner if needed.  Sincerely,  Rexene Alberts, DO Allergy & Immunology  [facility] office: [phone] [facility] office: [phone]

## 2018-11-14 NOTE — Assessment & Plan Note (Signed)
Past history - History of hives in 2012 which resolved after a few months. Thought to be triggered by her dental infection. Hives dormant up until 3 months ago. Denies any triggers but she had extensive dental work in December 2019. 2020 skin testing showed: Negative to environmental allergies and basic screening foods.  Interim history - 60% improved with daily allegra. Did not try zyrtec or BID dosing. Being followed by heme/onc for monoclonal gammopathy.  Continue allegra 180mg  once a day and may increase to twice a day as long as it does not cause drowsiness.  May try zyrtec 10mg  1-2 times a day instead of allegra.   If not doing better in 2 weeks advised patient to call so we can adjust her medications.

## 2018-11-16 ENCOUNTER — Inpatient Hospital Stay: Payer: BC Managed Care – PPO | Attending: Internal Medicine

## 2018-11-16 ENCOUNTER — Other Ambulatory Visit: Payer: Self-pay

## 2018-11-16 ENCOUNTER — Other Ambulatory Visit: Payer: Self-pay | Admitting: Internal Medicine

## 2018-11-16 DIAGNOSIS — D649 Anemia, unspecified: Secondary | ICD-10-CM | POA: Diagnosis not present

## 2018-11-16 DIAGNOSIS — M069 Rheumatoid arthritis, unspecified: Secondary | ICD-10-CM | POA: Diagnosis not present

## 2018-11-16 DIAGNOSIS — L509 Urticaria, unspecified: Secondary | ICD-10-CM | POA: Insufficient documentation

## 2018-11-16 DIAGNOSIS — D472 Monoclonal gammopathy: Secondary | ICD-10-CM | POA: Insufficient documentation

## 2018-11-16 LAB — CBC WITH DIFFERENTIAL (CANCER CENTER ONLY)
Abs Immature Granulocytes: 0.02 10*3/uL (ref 0.00–0.07)
Basophils Absolute: 0 10*3/uL (ref 0.0–0.1)
Basophils Relative: 0 %
Eosinophils Absolute: 0.2 10*3/uL (ref 0.0–0.5)
Eosinophils Relative: 4 %
HCT: 32.3 % — ABNORMAL LOW (ref 36.0–46.0)
Hemoglobin: 10 g/dL — ABNORMAL LOW (ref 12.0–15.0)
Immature Granulocytes: 0 %
Lymphocytes Relative: 51 %
Lymphs Abs: 3.1 10*3/uL (ref 0.7–4.0)
MCH: 28.3 pg (ref 26.0–34.0)
MCHC: 31 g/dL (ref 30.0–36.0)
MCV: 91.5 fL (ref 80.0–100.0)
Monocytes Absolute: 0.5 10*3/uL (ref 0.1–1.0)
Monocytes Relative: 8 %
Neutro Abs: 2.3 10*3/uL (ref 1.7–7.7)
Neutrophils Relative %: 37 %
Platelet Count: 130 10*3/uL — ABNORMAL LOW (ref 150–400)
RBC: 3.53 MIL/uL — ABNORMAL LOW (ref 3.87–5.11)
RDW: 13.8 % (ref 11.5–15.5)
WBC Count: 6.2 10*3/uL (ref 4.0–10.5)
nRBC: 0 % (ref 0.0–0.2)

## 2018-11-16 LAB — CMP (CANCER CENTER ONLY)
ALT: 15 U/L (ref 0–44)
AST: 20 U/L (ref 15–41)
Albumin: 3.5 g/dL (ref 3.5–5.0)
Alkaline Phosphatase: 101 U/L (ref 38–126)
Anion gap: 9 (ref 5–15)
BUN: 16 mg/dL (ref 8–23)
CO2: 27 mmol/L (ref 22–32)
Calcium: 8.9 mg/dL (ref 8.9–10.3)
Chloride: 103 mmol/L (ref 98–111)
Creatinine: 0.98 mg/dL (ref 0.44–1.00)
GFR, Est AFR Am: >60 mL/min (ref >60)
GFR, Estimated: >60 mL/min (ref >60)
Glucose, Bld: 86 mg/dL (ref 70–99)
Potassium: 3.7 mmol/L (ref 3.5–5.1)
Sodium: 139 mmol/L (ref 135–145)
Total Bilirubin: 0.4 mg/dL (ref 0.3–1.2)
Total Protein: 7.6 g/dL (ref 6.5–8.1)

## 2018-11-16 LAB — VITAMIN B12: Vitamin B-12: 336 pg/mL (ref 180–914)

## 2018-11-16 LAB — URIC ACID: Uric Acid, Serum: 5.2 mg/dL (ref 2.5–7.1)

## 2018-11-16 LAB — LACTATE DEHYDROGENASE: LDH: 177 U/L (ref 98–192)

## 2018-11-16 LAB — FOLATE: Folate: 18.6 ng/mL (ref >5.9)

## 2018-11-17 LAB — HEPATITIS B SURFACE ANTIGEN: Hepatitis B Surface Ag: NEGATIVE

## 2018-11-17 LAB — HEPATITIS B CORE ANTIBODY, TOTAL: Hep B Core Total Ab: NEGATIVE

## 2018-11-17 LAB — HEPATITIS B SURFACE ANTIBODY,QUALITATIVE: Hep B S Ab: NONREACTIVE

## 2018-11-17 LAB — HEPATITIS C ANTIBODY (REFLEX): HCV Ab: <0.1 s/co ratio (ref 0.0–0.9)

## 2018-11-17 LAB — HIV ANTIBODY (ROUTINE TESTING W REFLEX): HIV Screen 4th Generation wRfx: NONREACTIVE

## 2018-11-17 LAB — HCV COMMENT:

## 2018-11-21 ENCOUNTER — Telehealth: Payer: Self-pay | Admitting: *Deleted

## 2018-11-21 NOTE — Telephone Encounter (Signed)
Attempted to call pt on both home and cell phone unsuccessfully.  Left message on cell voice mail requesting a call back to nurse - to find out if pt was given a 24 hr urine container for collection. Per Dr. Walden Field, pt needs to do 24 hr urine collection prior to next office visit .

## 2018-11-24 LAB — METHYLMALONIC ACID, SERUM: Methylmalonic Acid, Quantitative: 132 nmol/L (ref 0–378)

## 2018-11-30 ENCOUNTER — Inpatient Hospital Stay: Payer: BC Managed Care – PPO | Admitting: Internal Medicine

## 2018-11-30 ENCOUNTER — Inpatient Hospital Stay: Payer: BC Managed Care – PPO

## 2018-11-30 DIAGNOSIS — D472 Monoclonal gammopathy: Secondary | ICD-10-CM | POA: Diagnosis not present

## 2018-12-03 LAB — UPEP/UIFE/LIGHT CHAINS/TP, 24-HR UR
% BETA, Urine: 0 %
ALPHA 1 URINE: 0 %
Albumin, U: 100 %
Alpha 2, Urine: 0 %
Free Kappa Lt Chains,Ur: 3.96 mg/L (ref 0.63–113.79)
Free Kappa/Lambda Ratio: >6.09 (ref 1.03–31.76)
Free Lambda Lt Chains,Ur: <0.65 mg/L (ref 0.47–11.77)
GAMMA GLOBULIN URINE: 0 %
Total Protein, Urine-Ur/day: 106 mg/24 hr (ref 30–150)
Total Protein, Urine: 4.4 mg/dL
Total Volume: 2400

## 2018-12-07 ENCOUNTER — Telehealth: Payer: Self-pay | Admitting: Internal Medicine

## 2018-12-07 NOTE — Telephone Encounter (Signed)
Scheduled appt per 6/26 sch message- unable to reach pt . Left message with appt date and time   

## 2018-12-21 ENCOUNTER — Inpatient Hospital Stay: Payer: BC Managed Care – PPO | Attending: Internal Medicine | Admitting: Internal Medicine

## 2018-12-21 DIAGNOSIS — D472 Monoclonal gammopathy: Secondary | ICD-10-CM

## 2018-12-21 NOTE — Progress Notes (Signed)
Virtual Visit via Telephone Note  I connected with Lindsay Bass on 12/21/18 at  2:00 PM EDT by telephone and verified that I am speaking with the correct person using two identifiers.   I discussed the limitations, risks, security and privacy concerns of performing an evaluation and management service by telephone and the availability of in person appointments. I also discussed with the patient that there may be a patient responsible charge related to this service. The patient expressed understanding and agreed to proceed.  Interval History:  Historical data obtained from note dated 11/09/2018.    68 year old female referred for evaluation due to monoclonal gammopathy.  Pt reports joint pain and is being followed by Dr. Maudie Mercury for periodic hives.  Recent SPEP done 08/17/2018 showed M spike of 0.7 g/dl.  Labs done 08/17/2018 showed WBC 5.5 HB 10.1 plts 158,000.  Chemistries WNL with K+ 3.8 Cr 1.08 and normal LFTs.  Pt has family history of breast cancer in maternal grandmother.      Observations/Objective: Review of labs and urine results.     Assessment and Plan:  1.  Monoclonal gammopathy.  68 year old female referred for evaluation due to monoclonal gammopathy.  Pt reports joint pain and is being followed by Dr. Maudie Mercury for periodic hives.  Recent SPEP done 08/17/2018 showed M spike of 0.7 g/dl.  Labs done 08/17/2018 showed WBC 5.5 HB 10.1 plts 158,000.  Chemistries WNL with K+ 3.8 Cr 1.08 and normal LFTs.  Pt has family history of breast cancer in maternal grandmother.   Labs done 10/25/2018 reviewed and showed WBC 5.4 HB 9.6 plts 148,000.  Chemistries showed K+ 3.6 Cr 0.97 and normal LFTs.  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  She has elevated FLC ratio of 4.76.  Quant IG show elevated IGA of 1198.  Skeletal survey done 11/01/2018 shows no lytic bone lesions.    Due to elevated IGA level, I discussed with her need for  further work-up. Increased levels of IgA are seen in several inflammatory disorders and several autoimmune diseases such as rheumatoid arthritis and lupus and IgA myeloma.  Pt has negative ANA but mildly elevated RF of 19.  Pt reports she was told in the past she has rheumatoid arthritis.  Husband also on phone call with pt.  I discussed that Hepatitis panel and HIV testing is negative.  UA WNL at 5.   24 hour UPEP for Protein and light chains and IFE showed normal IFE, with FLC ratio of 6.09.  No m spike or monoclonal protein noted in the urine.  FLC ratio in the serum is 4.76.  Pt has normal Cr 0.98 , normal calcium of 8.9. HB stable at 10.  Due to elevated FLC and anemia, pt was given option of bone marrow biopsy for diagnostic evaluation of marrow.   Currently pt desires observation and ongoing monitoring of lab studies.  She will RTC in 03/2019 for repeat labs.  Further work-up pending change in lab studies.  All questions answered and she expressed understanding of information presented.  Currently she has normal renal function and calcium level and no bone lesions.    2.  Anemia.  HB 10.  SPEP shows m spike measuring 0.5 g/dl.  Pt desires ongoing monitoring with repeat labs in 03/2019.    3.  Joint pain.  Pt has negative ANA and skeletal survey.  RF is elevated at 19.  Pt reportedly told she has RA.   Follow-up with PCP as directed.   4.  Urticaria.  Pt should follow-up with Dr. Maudie Mercury as directed.    5.  Family history of breast cancer.  Mammogram screening as recommended by PCP.    6.  Health maintenance.  Colonoscopy was done 04/06/2012 and was reported as normal.  GI follow-up as recommended.  Mammogram screening as recommended by PCP.     Follow Up Instructions: Labs and follow-up in 03/2019.      I discussed the assessment and treatment plan with the patient. The patient was provided an opportunity to ask questions and all were answered. The patient agreed with the plan and demonstrated  an understanding of the instructions.   The patient was advised to call back or seek an in-person evaluation if the symptoms worsen or if the condition fails to improve as anticipated.  I provided 15 minutes of non-face-to-face time during this encounter.   Zoila Shutter, MD

## 2018-12-24 ENCOUNTER — Telehealth: Payer: Self-pay | Admitting: Internal Medicine

## 2018-12-24 NOTE — Telephone Encounter (Signed)
Will schedule when template is available  °

## 2019-03-12 ENCOUNTER — Telehealth: Payer: Self-pay | Admitting: Hematology

## 2019-03-12 NOTE — Telephone Encounter (Signed)
[facility]gs transfer to [facility]. Spoke with patient re November lab/fu. Per patient she recently retired and is switching insurance. Per patient she will call to schedule when insurance piece is worked out. Per Maylon Peppers late October or early November.

## 2019-03-20 ENCOUNTER — Ambulatory Visit: Payer: BLUE CROSS/BLUE SHIELD | Admitting: Allergy

## 2019-03-20 NOTE — Progress Notes (Deleted)
Follow Up Note  RE: Lindsay Bass MRN: [id number] DOB: Aug 19, 1950 Date of Office Visit: 03/20/2019  Referring provider: Harlan Stains, MD Primary care provider: Harlan Stains, MD  Chief Complaint: No chief complaint on file.  History of Present Illness: I had the pleasure of seeing Lindsay Bass for a follow up visit at the [facility] on 03/20/2019. She is a 68 y.o. female, who is being followed for urticaria. Today she is here for regular follow up visit. Her previous allergy office visit was on 11/14/2018 with Dr. Maudie Mercury via telemedicine.   Urticaria Past history - History of hives in 2012 which resolved after a few months. Thought to be triggered by her dental infection. Hives dormant up until 3 months ago. Denies any triggers but she had extensive dental work in December 2019. 2020 skin testing showed: Negative to environmental allergies and basic screening foods.  Interim history - 60% improved with daily allegra. Did not try zyrtec or BID dosing. Being followed by heme/onc for monoclonal gammopathy.  Continue allegra 180mg  once a day and may increase to twice a day as long as it does not cause drowsiness. ? May try zyrtec 10mg  1-2 times a day instead of allegra.   If not doing better in 2 weeks advised patient to call so we can adjust her medications.   Return in about 4 months (around 03/16/2019).  Assessment and Plan: Lindsay Bass is a 68 y.o. female with: No problem-specific Assessment & Plan notes found for this encounter.  No follow-ups on file.  No orders of the defined types were placed in this encounter.  Lab Orders  No laboratory test(s) ordered today    Diagnostics: Spirometry:  Tracings reviewed. Her effort: {Blank single:19197::"Good reproducible efforts.","It was hard to get consistent efforts and there is a question as to whether this reflects a maximal maneuver.","Poor effort, data can not be interpreted."} FVC: ***L FEV1: ***L,  ***% predicted FEV1/FVC ratio: ***% Interpretation: {Blank single:19197::"Spirometry consistent with mild obstructive disease","Spirometry consistent with moderate obstructive disease","Spirometry consistent with severe obstructive disease","Spirometry consistent with possible restrictive disease","Spirometry consistent with mixed obstructive and restrictive disease","Spirometry uninterpretable due to technique","Spirometry consistent with normal pattern","No overt abnormalities noted given today's efforts"}.  Please see scanned spirometry results for details.  Skin Testing: {Blank single:19197::"Select foods","Environmental allergy panel","Environmental allergy panel and select foods","Food allergy panel","None","Deferred due to recent antihistamines use"}. Positive test to: ***. Negative test to: ***.  Results discussed with patient/family.   Medication List:  Current Outpatient Medications  Medication Sig Dispense Refill  . acetaminophen (TYLENOL) 500 MG tablet Take 500-1,000 mg by mouth every 6 (six) hours as needed (for pain or fever).     Marland Kitchen atorvastatin (LIPITOR) 80 MG tablet Take 80 mg by mouth daily.    . Calcium Carbonate 500 MG CHEW Chew 2 tablets by mouth daily.    . Cholecalciferol (D3 HIGH POTENCY) 125 MCG (5000 UT) capsule Take 5,000 Units by mouth daily.    . DENTA 5000 PLUS 1.1 % CREA dental cream Place 1 application onto teeth See admin instructions. Brush as directed with dental cream 1-2 times a day and do not rinse, eat, or drink for 30 minutes afterwards    . ezetimibe (ZETIA) 10 MG tablet Take 10 mg by mouth daily.    . fexofenadine (ALLEGRA) 180 MG tablet Take 180 mg by mouth daily.    Marland Kitchen losartan-hydrochlorothiazide (HYZAAR) 50-12.5 MG tablet Take 1 tablet by mouth daily.     No current facility-administered  medications for this visit.    Allergies: No Known Allergies I reviewed her past medical history, social history, family history, and environmental history and no  significant changes have been reported from her previous visit.  Review of Systems  Constitutional: Negative for appetite change, chills, fever and unexpected weight change.  HENT: Negative for congestion and rhinorrhea.   Eyes: Negative for itching.  Respiratory: Negative for cough, chest tightness, shortness of breath and wheezing.   Cardiovascular: Negative for chest pain.  Gastrointestinal: Negative for abdominal pain.  Genitourinary: Negative for difficulty urinating.  Skin: Positive for rash.  Allergic/Immunologic: Negative for environmental allergies and food allergies.  Neurological: Negative for headaches.   Objective: There were no vitals taken for this visit. There is no height or weight on file to calculate BMI. Physical Exam  Constitutional: She is oriented to person, place, and time. She appears well-developed and well-nourished.  Poor dentition  HENT:  Head: Normocephalic and atraumatic.  Right Ear: External ear normal.  Left Ear: External ear normal.  Nose: Nose normal.  Mouth/Throat: Oropharynx is clear and moist.  Eyes: Conjunctivae and EOM are normal.  Neck: Neck supple.  Cardiovascular: Normal rate, regular rhythm and normal heart sounds. Exam reveals no gallop and no friction rub.  No murmur heard. Pulmonary/Chest: Effort normal and breath sounds normal. She has no wheezes. She has no rales.  Abdominal: Soft.  Lymphadenopathy:    She has no cervical adenopathy.  Neurological: She is alert and oriented to person, place, and time.  Skin: Skin is warm. Rash noted.  Few urticarial lesions noted on forearm b/l  Psychiatric: She has a normal mood and affect. Her behavior is normal.  Nursing note and vitals reviewed.  Previous notes and tests were reviewed. The plan was reviewed with the patient/family, and all questions/concerned were addressed.  It was my pleasure to see Lindsay Bass today and participate in her care. Please feel free to contact me with any  questions or concerns.  Sincerely,  Rexene Alberts, DO Allergy & Immunology  [facility] office: [phone] [facility] office: [place] office: [phone]

## 2019-05-08 NOTE — Telephone Encounter (Signed)
[facility]gs transfer to [facility]. Left follow up message for patient re calling office to set up lab/fu. Patient was awaiting insurance change.

## 2019-10-24 ENCOUNTER — Encounter: Payer: Self-pay | Admitting: Gastroenterology

## 2019-11-18 ENCOUNTER — Emergency Department (HOSPITAL_COMMUNITY): Payer: Medicare Other

## 2019-11-18 ENCOUNTER — Other Ambulatory Visit: Payer: Self-pay

## 2019-11-18 ENCOUNTER — Emergency Department (HOSPITAL_COMMUNITY)
Admission: EM | Admit: 2019-11-18 | Discharge: 2019-11-18 | Disposition: A | Discharge status: Home / Self Care | Payer: Medicare Other | Attending: Emergency Medicine | Admitting: Emergency Medicine

## 2019-11-18 ENCOUNTER — Encounter (HOSPITAL_COMMUNITY): Payer: Self-pay

## 2019-11-18 DIAGNOSIS — Z79899 Other long term (current) drug therapy: Secondary | ICD-10-CM | POA: Diagnosis not present

## 2019-11-18 DIAGNOSIS — R109 Unspecified abdominal pain: Secondary | ICD-10-CM | POA: Insufficient documentation

## 2019-11-18 LAB — CBC WITH DIFFERENTIAL/PLATELET
Abs Immature Granulocytes: 0 10*3/uL (ref 0.00–0.07)
Basophils Absolute: 0 10*3/uL (ref 0.0–0.1)
Basophils Relative: 1 %
Eosinophils Absolute: 0.2 10*3/uL (ref 0.0–0.5)
Eosinophils Relative: 4 %
HCT: 33 % — ABNORMAL LOW (ref 36.0–46.0)
Hemoglobin: 10.3 g/dL — ABNORMAL LOW (ref 12.0–15.0)
Immature Granulocytes: 0 %
Lymphocytes Relative: 46 %
Lymphs Abs: 2.1 10*3/uL (ref 0.7–4.0)
MCH: 28.6 pg (ref 26.0–34.0)
MCHC: 31.2 g/dL (ref 30.0–36.0)
MCV: 91.7 fL (ref 80.0–100.0)
Monocytes Absolute: 0.5 10*3/uL (ref 0.1–1.0)
Monocytes Relative: 13 %
Neutro Abs: 1.6 10*3/uL — ABNORMAL LOW (ref 1.7–7.7)
Neutrophils Relative %: 36 %
Platelets: 135 10*3/uL — ABNORMAL LOW (ref 150–400)
RBC: 3.6 MIL/uL — ABNORMAL LOW (ref 3.87–5.11)
RDW: 13.4 % (ref 11.5–15.5)
WBC: 4.3 10*3/uL (ref 4.0–10.5)
nRBC: 0 % (ref 0.0–0.2)

## 2019-11-18 LAB — URINALYSIS, ROUTINE W REFLEX MICROSCOPIC
Bilirubin Urine: NEGATIVE
Glucose, UA: NEGATIVE mg/dL
Hgb urine dipstick: NEGATIVE
Ketones, ur: NEGATIVE mg/dL
Leukocytes,Ua: NEGATIVE
Nitrite: NEGATIVE
Protein, ur: NEGATIVE mg/dL
Specific Gravity, Urine: 1.006 (ref 1.005–1.030)
pH: 7 (ref 5.0–8.0)

## 2019-11-18 LAB — COMPREHENSIVE METABOLIC PANEL
ALT: 18 U/L (ref 0–44)
AST: 20 U/L (ref 15–41)
Albumin: 3.6 g/dL (ref 3.5–5.0)
Alkaline Phosphatase: 94 U/L (ref 38–126)
Anion gap: 10 (ref 5–15)
BUN: 13 mg/dL (ref 8–23)
CO2: 24 mmol/L (ref 22–32)
Calcium: 9 mg/dL (ref 8.9–10.3)
Chloride: 104 mmol/L (ref 98–111)
Creatinine, Ser: 1.42 mg/dL — ABNORMAL HIGH (ref 0.44–1.00)
GFR calc Af Amer: 44 mL/min — ABNORMAL LOW (ref >60)
GFR calc non Af Amer: 38 mL/min — ABNORMAL LOW (ref >60)
Glucose, Bld: 92 mg/dL (ref 70–99)
Potassium: 4.3 mmol/L (ref 3.5–5.1)
Sodium: 138 mmol/L (ref 135–145)
Total Bilirubin: 0.7 mg/dL (ref 0.3–1.2)
Total Protein: 7.6 g/dL (ref 6.5–8.1)

## 2019-11-18 LAB — LIPASE, BLOOD: Lipase: 37 U/L (ref 11–51)

## 2019-11-18 MED ORDER — MORPHINE SULFATE (PF) 4 MG/ML IV SOLN
4.0000 mg | Freq: Once | INTRAVENOUS | Status: AC
  Administered 2019-11-18: 4 mg via INTRAVENOUS
  Filled 2019-11-18: qty 1

## 2019-11-18 MED ORDER — METHOCARBAMOL 500 MG PO TABS
500.0000 mg | ORAL_TABLET | Freq: Two times a day (BID) | ORAL | 0 refills | Status: DC
Start: 2019-11-18 — End: 2019-11-27

## 2019-11-18 MED ORDER — LIDOCAINE 5 % EX PTCH: 1.0000 | MEDICATED_PATCH | CUTANEOUS | 0 refills | Status: DC

## 2019-11-18 MED ORDER — SODIUM CHLORIDE 0.9 % IV BOLUS
1000.0000 mL | Freq: Once | INTRAVENOUS | Status: AC
  Administered 2019-11-18: 1000 mL via INTRAVENOUS

## 2019-11-18 NOTE — ED Triage Notes (Signed)
Patient c/o right flank pain since last night. Patient states she has been taking antibiotics for a UTI and today will be the last day for the antibiotic.

## 2019-11-18 NOTE — ED Provider Notes (Signed)
[facility] DEPT Provider Note   CSN: [id number] Arrival date & time: 11/18/19  1031     History Chief Complaint  Patient presents with  . Flank Pain    Lindsay Bass is a 69 y.o. female history includes fibromyalgia, obesity, sleep apnea, colon polyps, anemia, hysterectomy.  Patient presents today for concern of right flank pain onset last night describes an aching pain constant nonradiating worsened with movement improved with rest, moderate intensity.  No clear inciting event.  Of note patient reports that she was diagnosed with urinary tract infection on Nov 07, 2019 at an urgent care.  She was prescribed Bactrim but was delayed in starting her antibiotics for around 1 week, she started taking medications 4 days ago and has had improvement in her dysuria.  She has 1 day left of Bactrim.  Denies fever/chills, chest pain/shortness of breath, cough/hemoptysis, nausea/vomiting, abdominal pain, dysuria/hematuria, diarrhea, blood in the stool, fall/injury, bowel/bladder incontinence, urinary retention, saddle or paresthesias or any additional concerns. HPI     Past Medical History:  Diagnosis Date  . Allergy   . Anemia   . Colon polyps 2013  . Fibromyalgia 2015  . High cholesterol   . Obesity 2005  . Sleep apnea    sometimes wear CPAP  . Urinary tract infection 2015  . Urticaria     Patient Active Problem List   Diagnosis Date Noted  . Urticaria 08/17/2018  . Pruritus 08/17/2018  . Dyspnea 06/09/2014  . Dysphagia 06/09/2014  . Chest pain 06/09/2014    Past Surgical History:  Procedure Laterality Date  . ABDOMINAL HYSTERECTOMY    . BREAST SURGERY     cyst removal, right  . COLONOSCOPY    . ESOPHAGEAL MANOMETRY N/A 02/08/2016   Procedure: ESOPHAGEAL MANOMETRY (EM);  Surgeon: Milus Banister, MD;  Location: [facility] ENDOSCOPY;  Service: Endoscopy;  Laterality: N/A;  . HERNIA MESH REMOVAL    . POLYPECTOMY       OB History   No obstetric  history on file.     Family History  Problem Relation Age of Onset  . Alzheimer's disease Mother   . Stroke Mother   . Heart attack Mother   . Heart disease Mother   . Breast cancer Maternal Grandmother   . Hypertension Maternal Grandmother   . Ovarian cancer Paternal Grandmother   . Heart attack Maternal Aunt   . Thyroid disease Daughter   . Colon cancer Neg Hx   . Esophageal cancer Neg Hx   . Stomach cancer Neg Hx   . Rectal cancer Neg Hx   . Diabetes Neg Hx   . Gallbladder disease Neg Hx     Social History   Tobacco Use  . Smoking status: Never Smoker  . Smokeless tobacco: Never Used  Substance Use Topics  . Alcohol use: No    Alcohol/week: 0.0 standard drinks  . Drug use: No    Home Medications Prior to Admission medications   Medication Sig Start Date End Date Taking? Authorizing Provider  acetaminophen (TYLENOL) 500 MG tablet Take 500-1,000 mg by mouth every 6 (six) hours as needed (for pain or fever).     [provider]  atorvastatin (LIPITOR) 80 MG tablet Take 80 mg by mouth daily.    [provider]  Calcium Carbonate 500 MG CHEW Chew 2 tablets by mouth daily.    [provider]  Cholecalciferol (D3 HIGH POTENCY) 125 MCG (5000 UT) capsule Take 5,000 Units by mouth  daily.    [provider]  DENTA 5000 PLUS 1.1 % CREA dental cream Place 1 application onto teeth See admin instructions. Brush as directed with dental cream 1-2 times a day and do not rinse, eat, or drink for 30 minutes afterwards 08/19/18   [provider]  ezetimibe (ZETIA) 10 MG tablet Take 10 mg by mouth daily.    [provider]  fexofenadine (ALLEGRA) 180 MG tablet Take 180 mg by mouth daily.    [provider]  lidocaine (LIDODERM) 5 % Place 1 patch onto the skin daily. Remove & Discard patch within 12 hours or as directed by MD 11/18/19   Deliah Boston, PA-C  losartan-hydrochlorothiazide (HYZAAR) 50-12.5 MG tablet Take 1 tablet by  mouth daily.    [provider]  methocarbamol (ROBAXIN) 500 MG tablet Take 1 tablet (500 mg total) by mouth 2 (two) times daily. 11/18/19   Deliah Boston, PA-C    Allergies    Patient has no known allergies.  Review of Systems   Review of Systems Ten systems are reviewed and are negative for acute change except as noted in the HPI  Physical Exam Updated Vital Signs BP 112/76   Pulse 69   Temp 98.2 F (36.8 C) (Oral)   Resp 16   Ht 5\' 1"  (1.549 m)   Wt 87.3 kg   SpO2 99%   BMI 36.35 kg/m   Physical Exam Constitutional:      General: She is not in acute distress.    Appearance: Normal appearance. She is well-developed. She is not ill-appearing or diaphoretic.  HENT:     Head: Normocephalic and atraumatic.  Eyes:     General: Vision grossly intact. Gaze aligned appropriately.     Pupils: Pupils are equal, round, and reactive to light.  Neck:     Trachea: Trachea and phonation normal.  Cardiovascular:     Pulses:          Dorsalis pedis pulses are 2+ on the right side and 2+ on the left side.  Pulmonary:     Effort: Pulmonary effort is normal. No respiratory distress.  Abdominal:     General: There is no distension.     Palpations: Abdomen is soft.     Tenderness: There is no abdominal tenderness. There is no guarding or rebound.  Musculoskeletal:        General: Normal range of motion.     Cervical back: Normal range of motion.     Comments: No midline C/T/L spinal tenderness to palpation, no deformity, crepitus, or step-off noted. No sign of injury to the neck or back.  Feet:     Right foot:     Protective Sensation: 5 sites tested. 5 sites sensed.     Left foot:     Protective Sensation: 5 sites tested. 5 sites sensed.  Skin:    General: Skin is warm and dry.  Neurological:     Mental Status: She is alert.     GCS: GCS eye subscore is 4. GCS verbal subscore is 5. GCS motor subscore is 6.     Comments: Speech is clear and goal oriented, follows  commands Major Cranial nerves without deficit, no facial droop Normal strength in upper and lower extremities bilaterally including dorsiflexion and plantar flexion, strong and equal grip strength Sensation normal to light and sharp touch Moves extremities without ataxia, coordination intact  Psychiatric:        Behavior: Behavior normal.  ED Results / Procedures / Treatments   Labs (all labs ordered are listed, but only abnormal results are displayed) Labs Reviewed  URINALYSIS, ROUTINE W REFLEX MICROSCOPIC - Abnormal; Notable for the following components:      Result Value   Color, Urine STRAW (*)    All other components within normal limits  CBC WITH DIFFERENTIAL/PLATELET - Abnormal; Notable for the following components:   RBC 3.60 (*)    Hemoglobin 10.3 (*)    HCT 33.0 (*)    Platelets 135 (*)    Neutro Abs 1.6 (*)    All other components within normal limits  COMPREHENSIVE METABOLIC PANEL - Abnormal; Notable for the following components:   Creatinine, Ser 1.42 (*)    GFR calc non Af Amer 38 (*)    GFR calc Af Amer 44 (*)    All other components within normal limits  LIPASE, BLOOD    EKG None  Radiology CT Renal Stone Study  Result Date: 11/18/2019 CLINICAL DATA:  Right-sided flank pain EXAM: CT ABDOMEN AND PELVIS WITHOUT CONTRAST TECHNIQUE: Multidetector CT imaging of the abdomen and pelvis was performed following the standard protocol without IV contrast. COMPARISON:  None. FINDINGS: Lower chest: No acute abnormality. Hepatobiliary: No focal liver abnormality is seen. No gallstones, gallbladder wall thickening, or biliary dilatation. Pancreas: Unremarkable. No pancreatic ductal dilatation or surrounding inflammatory changes. Spleen: Normal in size without focal abnormality. Adrenals/Urinary Tract: Adrenal glands are within normal limits. Kidneys show no renal calculi. No obstructive changes are noted. The bladder is well distended. No definitive calculi are noted.  Stomach/Bowel: The appendix is within normal limits. No obstructive or inflammatory changes of large or small bowel are seen. Stomach is decompressed. Vascular/Lymphatic: Aortic atherosclerosis. No enlarged abdominal or pelvic lymph nodes.Multiple ovarian vein phleboliths are seen. Reproductive: Uterus has been surgically removed. A rounded calcific lesion is noted in the right hemipelvis likely representing residual right ovary. Other: No abdominal wall hernia or abnormality. No abdominopelvic ascites. Musculoskeletal: No acute or significant osseous findings. IMPRESSION: No renal calculi or obstructive changes are noted. No acute abnormality to correspond with the patient's given clinical history. Electronically Signed   By: Inez Catalina M.D.   On: 11/18/2019 13:12    Procedures Procedures (including critical care time)  Medications Ordered in ED Medications  morphine 4 MG/ML injection 4 mg (4 mg Intravenous Given 11/18/19 1241)  sodium chloride 0.9 % bolus 1,000 mL (1,000 mLs Intravenous New Bag/Given 11/18/19 1405)    ED Course  I have reviewed the triage vital signs and the nursing notes.  Pertinent labs & imaging results that were available during my care of the patient were reviewed by me and considered in my medical decision making (see chart for details).    MDM Rules/Calculators/A&P                     Additional History Obtained: 1. Nursing notes from this visit. 2. No pertinent visits through EMR system this year.   I ordered, reviewed and interpreted labs which include: CBC shows no leukocytosis to suggest infection, hemoglobin of 10.3 appears baseline. Lipase within normal limits doubt pancreatitis. CMP shows no emergent lecture light derangement, acute elevation of LFTs or anion gap.  Creatinine of 1.42 appears slightly increased from prior at 1.0 however this was over a year ago, no significant AKI, will give fluid bolus. Urinalysis shows no evidence of infection.  CT Renal  Stone Study:  IMPRESSION:  No renal calculi or obstructive  changes are noted.    No acute abnormality to correspond with the patient's given clinical  history.   Patient received 4 mg IV morphine for pain control as well as a 1 L fluid bolus during this visit.  Suspect patient symptoms may be due to musculoskeletal right low back pain without sciatica.  There is no evidence of acute intra-abdominal pathology at this time, additionally patient has no chest pain, shortness of breath, tachycardia or hypoxia suggestive of PE or other intrathoracic etiology of her symptoms today.  Pain is reproducible with motion.  Plan of care is to treat as musculoskeletal pain at this time and have patient follow-up with her PCP for recheck this week.  Patient informed to finish taking her antibiotics as prescribed.  At this time there does not appear to be any evidence of an acute emergency medical condition and the patient appears stable for discharge with appropriate outpatient follow up. Diagnosis was discussed with patient who verbalizes understanding of care plan and is agreeable to discharge. I have discussed return precautions with patient who verbalizes understanding. Patient encouraged to follow-up with their PCP. All questions answered.  Patient seen and evaluated by Dr. Tomi Bamberger during this visit who agrees with discharge and outpatient PCP follow-up.  Note: Portions of this report may have been transcribed using voice recognition software. Every effort was made to ensure accuracy; however, inadvertent computerized transcription errors may still be present. Final Clinical Impression(s) / ED Diagnoses Final diagnoses:  Right flank pain    Rx / DC Orders ED Discharge Orders         Ordered    lidocaine (LIDODERM) 5 %  Every 24 hours     11/18/19 1431    methocarbamol (ROBAXIN) 500 MG tablet  2 times daily     11/18/19 1431           Gari Crown 11/18/19 1435    Dorie Rank,  MD 11/19/19 [phone]

## 2019-11-18 NOTE — Discharge Instructions (Addendum)
At this time there does not appear to be the presence of an emergent medical condition, however there is always the potential for conditions to change. Please read and follow the below instructions.  Please return to the Emergency Department immediately for any new or worsening symptoms. Please be sure to follow up with your Primary Care Provider within one week regarding your visit today; please call their office to schedule an appointment even if you are feeling better for a follow-up visit. You may use the Lidoderm patch as prescribed to help with your symptoms.  Lidoderm may be expensive so you may speak with your pharmacist about finding over-the-counter medications that work similarly. You may use the muscle relaxer Robaxin as prescribed to help with your symptoms.  Do not drive or operate heavy machinery while taking Robaxin as it will make you drowsy.  Do not drink alcohol or take other sedating medications while taking Robaxin as this will worsen side effects. Your CT scan today showed some incidental findings including ovarian vein phleboliths, aortic atherosclerosis, rounded calcific lesion in the right hemipelvis.  Please discuss these incidental findings with your primary care doctor at your follow-up visit.  Get help right away if: You have trouble breathing. You are short of breath. Your belly hurts, or it is swollen or red. You feel sick to your stomach (nauseous). You throw up (vomit). You feel like you will pass out, or you do pass out (faint). You have blood in your pee. You have pain in your chest You have fever or chills You have any new/concerning or worsening of symptoms  Please read the additional information packets attached to your discharge summary.  Do not take your medicine if  develop an itchy rash, swelling in your mouth or lips, or difficulty breathing; call 911 and seek immediate emergency medical attention if this occurs.  Note: Portions of this text may have  been transcribed using voice recognition software. Every effort was made to ensure accuracy; however, inadvertent computerized transcription errors may still be present.

## 2019-11-27 ENCOUNTER — Encounter: Payer: Self-pay | Admitting: Gastroenterology

## 2019-11-27 ENCOUNTER — Ambulatory Visit (INDEPENDENT_AMBULATORY_CARE_PROVIDER_SITE_OTHER): Payer: Medicare Other | Admitting: Gastroenterology

## 2019-11-27 VITALS — BP 143/77 | HR 93 | Ht 61.0 in | Wt 191.0 lb

## 2019-11-27 DIAGNOSIS — D649 Anemia, unspecified: Secondary | ICD-10-CM

## 2019-11-27 NOTE — Patient Instructions (Addendum)
If you are age 67 or older, your body mass index should be between 23-30. Your Body mass index is 36.09 kg/m. If this is out of the aforementioned range listed, please consider follow up with your Primary Care Provider.  If you are age 75 or younger, your body mass index should be between 19-25. Your Body mass index is 36.09 kg/m. If this is out of the aformentioned range listed, please consider follow up with your Primary Care Provider.   Please complete stool cards at home 1 week apart, then return to the lab in the basement.   Thank you for entrusting me with your care and choosing [facility].  Dr Ardis Hughs

## 2019-11-27 NOTE — Progress Notes (Signed)
Review of pertinent gastrointestinal problems: 1. Routine risk for colon cancer: 03/2012 Colonoscopy with Dr. Sharlett Iles for routine risk screening: "normal colonoscopy", however (confusingly) biopsy performed to "r/o adenoma."  Path showed no adenomatous change and he recommended that she have repeat screening examination in 10 years. 2.  Dysphagia led to upper endoscopy March 2016 Dr. Ardis Hughs.  This showed mild nonspecific gastritis that was biopsied.  Pancreatic rest.  Normal esophagus which was biopsied to check for eosinophilic esophagitis.  Pathology showed chronic inactive gastritis.  Esophageal biopsies were normal.  Eventual esophageal manometry September 2017 showed "ineffective esophageal motility with poor bolus clearance.  Findings were not consistent with achalasia"   HPI: This is a very pleasant 69 year old woman who was referred to me by Harlan Stains, MD  to evaluate iron deficiency anemia.    Chief complaint is anemia, right flank pain  She was last here in our office about 4 years ago.  She is here today for a new problem.  Her dysphagia for which I saw her several years ago has completely resolved.  She does have a minor intermittent pill associated issues but she has started taking her pills with extra sips of water and has completely alleviated the problem.  She has gained 20 pounds in the past year or so  She does not see any overt GI bleeding.  She has chronic alternating mild constipation diarrhea.  No significant abdominal pains.  No postprandial issues  Blood tests are her primary care physician suggested very mild iron deficiency anemia.  She also has a monoclonal gammopathy and ankylosing spondylitis, rheumatologic issues.  Obviously anemia of chronic disease is possible here as well.  She was in the emergency room last week with right low back pain that radiated around to her right thigh.  Old Data Reviewed:  Blood work June 2021 hemoglobin 10.3, platelets 135,  MCV 92, complete metabolic profile was normal except for creatinine 1.4  Blood work April 2020 hemoglobin 10.4 MCV 91, platelets 115  Blood work from her primary physician shows a hemoglobin of 11.2, iron level 42, iron saturation 13.   She has been followed by hematology oncology and was found to have a monoclonal gammopathy.  CT renal stone study June 2021 for right-sided abdominal pain, right flank pain was essentially normal.   Review of systems: Pertinent positive and negative review of systems were noted in the above HPI section. All other review negative.   Past Medical History:  Diagnosis Date  . Allergy   . Anemia   . Colon polyps 2013  . Fibromyalgia 2015  . High cholesterol   . Obesity 2005  . Sleep apnea    sometimes wear CPAP  . Urinary tract infection 2015  . Urticaria     Past Surgical History:  Procedure Laterality Date  . ABDOMINAL HYSTERECTOMY    . BREAST SURGERY     cyst removal, right  . COLONOSCOPY    . ESOPHAGEAL MANOMETRY N/A 02/08/2016   Procedure: ESOPHAGEAL MANOMETRY (EM);  Surgeon: Milus Banister, MD;  Location: [facility];  Service: Endoscopy;  Laterality: N/A;  . HERNIA MESH REMOVAL    . POLYPECTOMY    . SHOULDER SURGERY Right     Current Outpatient Medications  Medication Sig Dispense Refill  . acetaminophen (TYLENOL) 500 MG tablet Take 500-1,000 mg by mouth every 6 (six) hours as needed (for pain or fever).     Marland Kitchen atorvastatin (LIPITOR) 80 MG tablet Take 80 mg by mouth daily.    Marland Kitchen  Cholecalciferol (D3 HIGH POTENCY) 125 MCG (5000 UT) capsule Take 5,000 Units by mouth daily.    Marland Kitchen ezetimibe (ZETIA) 10 MG tablet Take 10 mg by mouth daily.    . Ferrous Sulfate (IRON) 142 (45 Fe) MG TBCR Take by mouth.    . fexofenadine (ALLEGRA) 180 MG tablet Take 180 mg by mouth daily.    . fluticasone (FLONASE) 50 MCG/ACT nasal spray Place into both nostrils daily.    Marland Kitchen losartan-hydrochlorothiazide (HYZAAR) 50-12.5 MG tablet Take 1 tablet by mouth daily.      No current facility-administered medications for this visit.    Allergies as of 11/27/2019  . (No Known Allergies)    Family History  Problem Relation Age of Onset  . Alzheimer's disease Mother   . Stroke Mother   . Heart attack Mother   . Heart disease Mother   . Breast cancer Maternal Grandmother   . Hypertension Maternal Grandmother   . Ovarian cancer Paternal Grandmother   . Heart attack Maternal Aunt   . Thyroid disease Daughter   . Colon polyps Sister   . Colon polyps Brother   . Colon cancer Neg Hx   . Esophageal cancer Neg Hx   . Stomach cancer Neg Hx   . Rectal cancer Neg Hx   . Diabetes Neg Hx   . Gallbladder disease Neg Hx     Social History   Socioeconomic History  . Marital status: Married    Spouse name: Not on file  . Number of children: 2  . Years of education: Not on file  . Highest education level: Not on file  Occupational History  . Occupation: retired  Tobacco Use  . Smoking status: Never Smoker  . Smokeless tobacco: Never Used  Vaping Use  . Vaping Use: Never used  Substance and Sexual Activity  . Alcohol use: No    Alcohol/week: 0.0 standard drinks  . Drug use: No  . Sexual activity: Not Currently    Birth control/protection: Surgical  Other Topics Concern  . Not on file  Social History Narrative   Tobacco use cigarettes :Never smoked ,   Tobacco history last updated 07/15/2013.no smoking   Alcohol : Rare no Recreational drugs use. Exercise: very active at work ,no formal exercise. 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 Marital Status: Married Scientist, forensic. Children : Melanie ([place]) Monica (W/S), 3 steps Denman George 12 grands, 1 great grand. Religion: Love and Faith         Social Determinants of Radio broadcast assistant Strain:   . Difficulty of Paying Living Expenses:   Food Insecurity:   . Worried About Charity fundraiser in the Last Year:   . Arboriculturist in the Last Year:   Transportation Needs:   .  Film/video editor (Medical):   Marland Kitchen Lack of Transportation (Non-Medical):   Physical Activity:   . Days of Exercise per Week:   . Minutes of Exercise per Session:   Stress:   . Feeling of Stress :   Social Connections:   . Frequency of Communication with Friends and Family:   . Frequency of Social Gatherings with Friends and Family:   . Attends Religious Services:   . Active Member of Clubs or Organizations:   . Attends Archivist Meetings:   Marland Kitchen Marital Status:   Intimate Partner Violence:   . Fear of Current or Ex-Partner:   . Emotionally Abused:   Marland Kitchen Physically Abused:   . Sexually  Abused:      Physical Exam: BP (!) 143/77   Pulse 93   Ht 5\' 1"  (1.549 m)   Wt 191 lb (86.6 kg)   SpO2 98%   BMI 36.09 kg/m  Constitutional: generally well-appearing Psychiatric: alert and oriented x3 Eyes: extraocular movements intact Mouth: oral pharynx moist, no lesions Neck: supple no lymphadenopathy Cardiovascular: heart regular rate and rhythm Lungs: clear to auscultation bilaterally Abdomen: soft, nontender, nondistended, no obvious ascites, no peritoneal signs, normal bowel sounds Extremities: no lower extremity edema bilaterally Skin: no lesions on visible extremities   Assessment and plan: 69 y.o. female with mild anemia  Her anemia is likely multifactorial including monoclonal gammopathy, rheumatologic process of ankylosing spondylitis and chronic disease, also she had mild iron deficiency.  She has no specific GI symptoms.  Her right flank pain recently was clearly sciatica and not GI related.  Her last colonoscopy was about 8 years ago.  I recommended checking her stool for microscopic blood with 2 home stool kits.  If any of these are positive for blood then I would likely recommend colonoscopy given her mild iron deficiency component to her mixed anemia.  Please see the "Patient Instructions" section for addition details about the plan.   Owens Loffler,  MD [facility] 11/27/2019, 10:14 AM  Cc: Harlan Stains, MD  Total time on date of encounter was 45  minutes (this included time spent preparing to see the patient reviewing records; obtaining and/or reviewing separately obtained history; performing a medically appropriate exam and/or evaluation; counseling and educating the patient and family if present; ordering medications, tests or procedures if applicable; and documenting clinical information in the health record).

## 2019-12-05 ENCOUNTER — Telehealth: Payer: Self-pay | Admitting: Gastroenterology

## 2019-12-05 NOTE — Telephone Encounter (Signed)
The pt states she will complete her stool test this week.

## 2019-12-13 ENCOUNTER — Other Ambulatory Visit (INDEPENDENT_AMBULATORY_CARE_PROVIDER_SITE_OTHER): Payer: Medicare Other

## 2019-12-13 DIAGNOSIS — D649 Anemia, unspecified: Secondary | ICD-10-CM | POA: Diagnosis not present

## 2019-12-13 LAB — HEMOCCULT SLIDES (X 3 CARDS)
Fecal Occult Blood: NEGATIVE
Fecal Occult Blood: NEGATIVE
OCCULT 1: NEGATIVE
OCCULT 1: NEGATIVE
OCCULT 2: NEGATIVE
OCCULT 2: NEGATIVE
OCCULT 3: NEGATIVE
OCCULT 3: NEGATIVE
OCCULT 4: NEGATIVE
OCCULT 4: NEGATIVE
OCCULT 5: NEGATIVE
OCCULT 5: NEGATIVE

## 2020-05-30 ENCOUNTER — Ambulatory Visit: Payer: Medicare Other | Attending: Internal Medicine

## 2020-05-30 DIAGNOSIS — Z23 Encounter for immunization: Secondary | ICD-10-CM

## 2020-05-30 NOTE — Progress Notes (Signed)
**Note Lindsay-Identified via Obfuscation**    Covid-19 Vaccination Clinic  Name:  DEMIRA Bass    MRN: [id number] DOB: Aug 15, 1950  05/30/2020  Lindsay Bass was observed post Covid-19 immunization for 15 minutes without incident. She was provided with Vaccine Information Sheet and instruction to access the V-Safe system.   Lindsay Bass was instructed to call 911 with any severe reactions post vaccine: Marland Kitchen Difficulty breathing  . Swelling of face and throat  . A fast heartbeat  . A bad rash all over body  . Dizziness and weakness   Immunizations Administered    Name Date Dose VIS Date Route   Pfizer COVID-19 Vaccine 05/30/2020  9:13 AM 0.3 mL 04/01/2020 Intramuscular   Manufacturer: [place]   Lot: [id number]   [place]: [id number]

## 2020-08-05 DIAGNOSIS — M1990 Unspecified osteoarthritis, unspecified site: Secondary | ICD-10-CM | POA: Diagnosis not present

## 2020-08-05 DIAGNOSIS — E785 Hyperlipidemia, unspecified: Secondary | ICD-10-CM | POA: Diagnosis not present

## 2020-08-05 DIAGNOSIS — I10 Essential (primary) hypertension: Secondary | ICD-10-CM | POA: Diagnosis not present

## 2020-08-07 DIAGNOSIS — M545 Low back pain, unspecified: Secondary | ICD-10-CM | POA: Diagnosis not present

## 2020-08-18 DIAGNOSIS — M459 Ankylosing spondylitis of unspecified sites in spine: Secondary | ICD-10-CM | POA: Diagnosis not present

## 2020-08-18 DIAGNOSIS — M450 Ankylosing spondylitis of multiple sites in spine: Secondary | ICD-10-CM | POA: Diagnosis not present

## 2020-08-18 DIAGNOSIS — D472 Monoclonal gammopathy: Secondary | ICD-10-CM | POA: Diagnosis not present

## 2020-08-18 DIAGNOSIS — D649 Anemia, unspecified: Secondary | ICD-10-CM | POA: Diagnosis not present

## 2020-08-18 DIAGNOSIS — H0589 Other disorders of orbit: Secondary | ICD-10-CM | POA: Diagnosis not present

## 2020-08-24 DIAGNOSIS — M459 Ankylosing spondylitis of unspecified sites in spine: Secondary | ICD-10-CM | POA: Diagnosis not present

## 2020-08-24 DIAGNOSIS — D638 Anemia in other chronic diseases classified elsewhere: Secondary | ICD-10-CM | POA: Diagnosis not present

## 2020-08-24 DIAGNOSIS — Z79899 Other long term (current) drug therapy: Secondary | ICD-10-CM | POA: Diagnosis not present

## 2020-08-24 DIAGNOSIS — M5136 Other intervertebral disc degeneration, lumbar region: Secondary | ICD-10-CM | POA: Diagnosis not present

## 2020-08-24 DIAGNOSIS — M461 Sacroiliitis, not elsewhere classified: Secondary | ICD-10-CM | POA: Diagnosis not present

## 2020-08-24 DIAGNOSIS — M159 Polyosteoarthritis, unspecified: Secondary | ICD-10-CM | POA: Diagnosis not present

## 2020-08-25 DIAGNOSIS — M25551 Pain in right hip: Secondary | ICD-10-CM | POA: Diagnosis not present

## 2020-08-31 DIAGNOSIS — M25551 Pain in right hip: Secondary | ICD-10-CM | POA: Diagnosis not present

## 2020-09-03 DIAGNOSIS — M25551 Pain in right hip: Secondary | ICD-10-CM | POA: Diagnosis not present

## 2020-09-22 DIAGNOSIS — Z1231 Encounter for screening mammogram for malignant neoplasm of breast: Secondary | ICD-10-CM | POA: Diagnosis not present

## 2020-10-27 DIAGNOSIS — H04123 Dry eye syndrome of bilateral lacrimal glands: Secondary | ICD-10-CM | POA: Diagnosis not present

## 2020-11-24 DIAGNOSIS — M461 Sacroiliitis, not elsewhere classified: Secondary | ICD-10-CM | POA: Diagnosis not present

## 2020-11-24 DIAGNOSIS — M459 Ankylosing spondylitis of unspecified sites in spine: Secondary | ICD-10-CM | POA: Diagnosis not present

## 2020-11-24 DIAGNOSIS — M5136 Other intervertebral disc degeneration, lumbar region: Secondary | ICD-10-CM | POA: Diagnosis not present

## 2020-11-24 DIAGNOSIS — M159 Polyosteoarthritis, unspecified: Secondary | ICD-10-CM | POA: Diagnosis not present

## 2020-11-27 ENCOUNTER — Other Ambulatory Visit: Payer: Self-pay | Admitting: Family Medicine

## 2020-11-27 DIAGNOSIS — E041 Nontoxic single thyroid nodule: Secondary | ICD-10-CM

## 2020-12-18 ENCOUNTER — Ambulatory Visit
Admission: RE | Admit: 2020-12-18 | Discharge: 2020-12-18 | Disposition: A | Discharge status: Home / Self Care | Payer: Medicare Other | Source: Ambulatory Visit | Attending: Family Medicine | Admitting: Family Medicine

## 2020-12-18 ENCOUNTER — Other Ambulatory Visit: Payer: Self-pay

## 2020-12-18 DIAGNOSIS — E041 Nontoxic single thyroid nodule: Secondary | ICD-10-CM | POA: Diagnosis not present

## 2020-12-23 DIAGNOSIS — D649 Anemia, unspecified: Secondary | ICD-10-CM | POA: Diagnosis not present

## 2020-12-23 DIAGNOSIS — D472 Monoclonal gammopathy: Secondary | ICD-10-CM | POA: Diagnosis not present

## 2020-12-23 DIAGNOSIS — M459 Ankylosing spondylitis of unspecified sites in spine: Secondary | ICD-10-CM | POA: Diagnosis not present

## 2020-12-23 DIAGNOSIS — M450 Ankylosing spondylitis of multiple sites in spine: Secondary | ICD-10-CM | POA: Diagnosis not present

## 2021-01-28 DIAGNOSIS — E538 Deficiency of other specified B group vitamins: Secondary | ICD-10-CM | POA: Diagnosis not present

## 2021-01-28 DIAGNOSIS — I10 Essential (primary) hypertension: Secondary | ICD-10-CM | POA: Diagnosis not present

## 2021-01-28 DIAGNOSIS — J301 Allergic rhinitis due to pollen: Secondary | ICD-10-CM | POA: Diagnosis not present

## 2021-01-28 DIAGNOSIS — E559 Vitamin D deficiency, unspecified: Secondary | ICD-10-CM | POA: Diagnosis not present

## 2021-01-28 DIAGNOSIS — Z0000 Encounter for general adult medical examination without abnormal findings: Secondary | ICD-10-CM | POA: Diagnosis not present

## 2021-01-28 DIAGNOSIS — E785 Hyperlipidemia, unspecified: Secondary | ICD-10-CM | POA: Diagnosis not present

## 2021-01-28 DIAGNOSIS — Z23 Encounter for immunization: Secondary | ICD-10-CM | POA: Diagnosis not present

## 2021-01-28 DIAGNOSIS — D472 Monoclonal gammopathy: Secondary | ICD-10-CM | POA: Diagnosis not present

## 2021-01-28 DIAGNOSIS — R7301 Impaired fasting glucose: Secondary | ICD-10-CM | POA: Diagnosis not present

## 2021-01-28 DIAGNOSIS — R0989 Other specified symptoms and signs involving the circulatory and respiratory systems: Secondary | ICD-10-CM | POA: Diagnosis not present

## 2021-01-29 ENCOUNTER — Other Ambulatory Visit: Payer: Self-pay | Admitting: Family Medicine

## 2021-01-29 DIAGNOSIS — R0989 Other specified symptoms and signs involving the circulatory and respiratory systems: Secondary | ICD-10-CM

## 2021-02-05 ENCOUNTER — Ambulatory Visit
Admission: RE | Admit: 2021-02-05 | Discharge: 2021-02-05 | Disposition: A | Discharge status: Home / Self Care | Payer: Medicare Other | Source: Ambulatory Visit | Attending: Family Medicine | Admitting: Family Medicine

## 2021-02-05 DIAGNOSIS — R0989 Other specified symptoms and signs involving the circulatory and respiratory systems: Secondary | ICD-10-CM | POA: Diagnosis not present

## 2021-05-03 DIAGNOSIS — M450 Ankylosing spondylitis of multiple sites in spine: Secondary | ICD-10-CM | POA: Diagnosis not present

## 2021-05-03 DIAGNOSIS — D649 Anemia, unspecified: Secondary | ICD-10-CM | POA: Diagnosis not present

## 2021-05-03 DIAGNOSIS — D472 Monoclonal gammopathy: Secondary | ICD-10-CM | POA: Diagnosis not present

## 2021-05-03 DIAGNOSIS — M459 Ankylosing spondylitis of unspecified sites in spine: Secondary | ICD-10-CM | POA: Diagnosis not present

## 2021-05-23 ENCOUNTER — Emergency Department (HOSPITAL_COMMUNITY)
Admission: EM | Admit: 2021-05-23 | Discharge: 2021-05-23 | Disposition: A | Discharge status: Home / Self Care | Payer: Medicare Other | Attending: Emergency Medicine | Admitting: Emergency Medicine

## 2021-05-23 ENCOUNTER — Encounter (HOSPITAL_COMMUNITY): Payer: Self-pay | Admitting: Emergency Medicine

## 2021-05-23 ENCOUNTER — Other Ambulatory Visit: Payer: Self-pay

## 2021-05-23 DIAGNOSIS — R059 Cough, unspecified: Secondary | ICD-10-CM | POA: Diagnosis present

## 2021-05-23 DIAGNOSIS — Z79899 Other long term (current) drug therapy: Secondary | ICD-10-CM | POA: Insufficient documentation

## 2021-05-23 DIAGNOSIS — J069 Acute upper respiratory infection, unspecified: Secondary | ICD-10-CM

## 2021-05-23 DIAGNOSIS — U071 COVID-19: Secondary | ICD-10-CM | POA: Diagnosis not present

## 2021-05-23 DIAGNOSIS — I10 Essential (primary) hypertension: Secondary | ICD-10-CM | POA: Insufficient documentation

## 2021-05-23 LAB — RESP PANEL BY RT-PCR (FLU A&B, COVID) ARPGX2
Influenza A by PCR: NEGATIVE
Influenza B by PCR: NEGATIVE
SARS Coronavirus 2 by RT PCR: POSITIVE — AB

## 2021-05-23 MED ORDER — NIRMATRELVIR/RITONAVIR (PAXLOVID)TABLET
3.0000 | ORAL_TABLET | Freq: Two times a day (BID) | ORAL | 0 refills | Status: AC
Start: 2021-05-23 — End: 2021-05-28

## 2021-05-23 NOTE — ED Provider Notes (Signed)
Emergency Medicine Provider Triage Evaluation Note  Lindsay Bass , a 70 y.o. female  was evaluated in triage.  Pt complains of cough, loss of taste and smell, body aches, congestion and chills for 4 days now.  Her fever and chills have been better for 2 days.  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 No shortness of breath or chest pain.  Review of Systems  Positive: Cough, fevers, chills, loss of taste and smell Negative: Chest pain, shortness of breath  Physical Exam  BP 124/74   Pulse 85   Temp 99 F (37.2 C) (Oral)   Resp 17   SpO2 98%  Gen:   Awake, no distress   Resp:  Normal effort lungs clear to auscultation bilaterally, frequent cough. MSK:   Moves extremities without difficulty  Other:  Normal gait  Medical Decision Making  Medically screening exam initiated at 4:25 PM.  Appropriate orders placed.  Lindsay Bass was informed that the remainder of the evaluation will be completed by another provider, this initial triage assessment does not replace that evaluation, and the importance of remaining in the ED until their evaluation is complete.  Note: Portions of this report may have been transcribed using voice recognition software. Every effort was made to ensure accuracy; however, inadvertent computerized transcription errors may be present    Lindsay Bass 05/23/21 1627    Truddie Hidden, MD 05/24/21 Lurena Nida

## 2021-05-23 NOTE — ED Triage Notes (Signed)
Pt reports loss of taste and smell, body aches, congestion, cough, fever, and chills since Thursday.  States fever and chills have been resolved x 2 days.

## 2021-05-23 NOTE — Discharge Instructions (Addendum)
You most likely have a viral illness.  Your COVID and flu tests are still pending.  Please follow-up on these results later this evening.  We have printed off a prescription for Paxlovid, which she can fill if your COVID test is positive.  Please follow-up with your PCP in 3 to 5 days for a reassessment.  Return to the ED with any worsening fevers, new chest pain or difficulty breathing, leg pain or swelling.

## 2021-05-23 NOTE — ED Provider Notes (Signed)
[facility] EMERGENCY DEPARTMENT Provider Note   CSN: [id number] Arrival date & time: 05/23/21  1557     History Chief Complaint  Patient presents with   Nasal Congestion   Cough    Lindsay Bass is a 70 y.o. female.  The history is provided by the patient and medical records.  Cough Cough characteristics:  Non-productive Sputum characteristics:  Unable to specify Severity:  Moderate Onset quality:  Gradual Duration:  4 days Timing:  Constant Progression:  Unchanged Chronicity:  New Context: sick contacts and upper respiratory infection   Relieved by:  Nothing Worsened by:  Nothing Associated symptoms: fever and rhinorrhea   Associated symptoms: no chest pain, no chills, no ear pain, no rash, no shortness of breath and no sore throat       Past Medical History:  Diagnosis Date   Allergy    Anemia    Colon polyps 2013   Fibromyalgia 2015   High cholesterol    Obesity 2005   Sleep apnea    sometimes wear CPAP   Urinary tract infection 2015   Urticaria     Patient Active Problem List   Diagnosis Date Noted   Anemia 11/27/2019   Urticaria 08/17/2018   Pruritus 08/17/2018   Hypercholesterolemia 07/13/2017   Hypertensive disorder 07/13/2017   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     Family History  Problem Relation Age of Onset   Alzheimer's disease Mother    Stroke Mother    Heart attack Mother    Heart disease Mother    Breast cancer Maternal  Grandmother    Hypertension Maternal Grandmother    Ovarian cancer Paternal Grandmother    Heart attack Maternal Aunt    Thyroid disease Daughter    Colon polyps Sister    Colon polyps Brother    Colon cancer Neg Hx    Esophageal cancer Neg Hx    Stomach cancer Neg Hx    Rectal cancer Neg Hx    Diabetes Neg Hx    Gallbladder disease Neg Hx     Social History   Tobacco Use   Smoking status: Never   Smokeless tobacco: Never  Vaping Use   Vaping Use: Never used  Substance Use Topics   Alcohol use: No    Alcohol/week: 0.0 standard drinks   Drug use: No    Home Medications Prior to Admission medications   Medication Sig Start Date End Date Taking? Authorizing Provider  acetaminophen (TYLENOL) 500 MG tablet Take 500-1,000 mg by mouth every 6 (six) hours as needed (for pain or fever).     [provider]  atorvastatin (LIPITOR) 80 MG tablet Take 80 mg by mouth daily.    [provider]  Cholecalciferol (D3 HIGH POTENCY) 125 MCG (5000 UT) capsule Take 5,000 Units by mouth daily.    [provider]  ezetimibe (ZETIA) 10 MG tablet  Take 10 mg by mouth daily.    [provider]  Ferrous Sulfate (IRON) 142 (45 Fe) MG TBCR Take by mouth.    [provider]  fexofenadine (ALLEGRA) 180 MG tablet Take 180 mg by mouth daily.    [provider]  fluticasone (FLONASE) 50 MCG/ACT nasal spray Place into both nostrils daily.    [provider]  losartan-hydrochlorothiazide (HYZAAR) 50-12.5 MG tablet Take 1 tablet by mouth daily.    [provider]    Allergies    Patient has no known allergies.  Review of Systems   Review of Systems  Constitutional:  Positive for fatigue and fever. Negative for chills.  HENT:  Positive for congestion and rhinorrhea. Negative for ear pain and sore throat.   Eyes:  Negative for pain and visual disturbance.  Respiratory:  Positive for cough. Negative for shortness of breath.    Cardiovascular:  Negative for chest pain and palpitations.  Gastrointestinal:  Negative for abdominal pain and vomiting.  Genitourinary:  Negative for dysuria and hematuria.  Musculoskeletal:  Negative for arthralgias and back pain.  Skin:  Negative for color change and rash.  Neurological:  Negative for seizures and syncope.  All other systems reviewed and are negative.  Physical Exam Updated Vital Signs BP 124/74   Pulse 85   Temp 99 F (37.2 C) (Oral)   Resp 17   SpO2 98%   Physical Exam Vitals and nursing note reviewed.  Constitutional:      General: She is not in acute distress.    Appearance: Normal appearance. She is well-developed.  HENT:     Head: Normocephalic and atraumatic.     Right Ear: External ear normal.     Left Ear: External ear normal.     Nose: Nose normal. No congestion or rhinorrhea.     Mouth/Throat:     Mouth: Mucous membranes are moist.  Eyes:     Extraocular Movements: Extraocular movements intact.     Conjunctiva/sclera: Conjunctivae normal.     Pupils: Pupils are equal, round, and reactive to light.  Cardiovascular:     Rate and Rhythm: Normal rate and regular rhythm.     Pulses: Normal pulses.     Heart sounds: No murmur heard. Pulmonary:     Effort: Pulmonary effort is normal. No respiratory distress.     Breath sounds: Normal breath sounds. No wheezing, rhonchi or rales.  Abdominal:     General: Abdomen is flat. Bowel sounds are normal.     Palpations: Abdomen is soft.     Tenderness: There is no abdominal tenderness. There is no guarding or rebound.  Musculoskeletal:        General: No swelling, tenderness or deformity.     Cervical back: Normal range of motion and neck supple. No rigidity.  Skin:    General: Skin is warm and dry.     Capillary Refill: Capillary refill takes less than 2 seconds.  Neurological:     General: No focal deficit present.     Mental Status: She is alert and oriented to person, place, and time.   Psychiatric:        Mood and Affect: Mood normal.    ED Results / Procedures / Treatments   Labs (all labs ordered are listed, but only abnormal results are displayed) Labs Reviewed  RESP PANEL BY RT-PCR (FLU A&B, COVID) [id number]    EKG None  Radiology No results found.  Procedures Procedures   Medications Ordered  in ED Medications - No data to display  ED Course  I have reviewed the triage vital signs and the nursing notes.  Pertinent labs & imaging results that were available during my care of the patient were reviewed by me and considered in my medical decision making (see chart for details).    MDM Rules/Calculators/A&P                          70 year old female with a history of hypertension, hyperlipidemia, fibromyalgia presenting with flulike symptoms for the past 4 days.  She is afebrile and hemodynamically stable on arrival.  She is nontoxic-appearing.  She is tolerating p.o.  Her lungs are clear to auscultation bilaterally.  She does not have productive cough.  I believe suspicion for pneumonia.  No indications for chest imaging.  She has no chest pain or shortness of breath.  No leg swelling.  Low concern for ACS and PE.  Presentation most consistent with viral illness.  COVID and flu testing are pending.  She is out of the window for Tamiflu, but offered prescription for Paxil to be filled if.  COVID test is positive.  Patient agreeable with plan.  She already has access to her MyChart and will check for results later this evening.  Encourage close follow-up with PCP for reassessment.  Strict return precautions provided.  Final Clinical Impression(s) / ED Diagnoses Final diagnoses:  None    Rx / DC Orders ED Discharge Orders     None        Idamae Lusher, MD 05/23/21 1708    Davonna Belling, MD 05/24/21 1500

## 2021-07-01 DIAGNOSIS — M461 Sacroiliitis, not elsewhere classified: Secondary | ICD-10-CM | POA: Diagnosis not present

## 2021-07-01 DIAGNOSIS — M459 Ankylosing spondylitis of unspecified sites in spine: Secondary | ICD-10-CM | POA: Diagnosis not present

## 2021-07-01 DIAGNOSIS — M5136 Other intervertebral disc degeneration, lumbar region: Secondary | ICD-10-CM | POA: Diagnosis not present

## 2021-07-01 DIAGNOSIS — M6530 Trigger finger, unspecified finger: Secondary | ICD-10-CM | POA: Diagnosis not present

## 2021-07-01 DIAGNOSIS — M159 Polyosteoarthritis, unspecified: Secondary | ICD-10-CM | POA: Diagnosis not present

## 2021-08-05 DIAGNOSIS — J301 Allergic rhinitis due to pollen: Secondary | ICD-10-CM | POA: Diagnosis not present

## 2021-08-05 DIAGNOSIS — I10 Essential (primary) hypertension: Secondary | ICD-10-CM | POA: Diagnosis not present

## 2021-08-05 DIAGNOSIS — D472 Monoclonal gammopathy: Secondary | ICD-10-CM | POA: Diagnosis not present

## 2021-08-05 DIAGNOSIS — M1990 Unspecified osteoarthritis, unspecified site: Secondary | ICD-10-CM | POA: Diagnosis not present

## 2021-08-05 DIAGNOSIS — E785 Hyperlipidemia, unspecified: Secondary | ICD-10-CM | POA: Diagnosis not present

## 2021-08-05 DIAGNOSIS — Z23 Encounter for immunization: Secondary | ICD-10-CM | POA: Diagnosis not present

## 2021-09-23 DIAGNOSIS — Z1231 Encounter for screening mammogram for malignant neoplasm of breast: Secondary | ICD-10-CM | POA: Diagnosis not present

## 2021-10-01 DIAGNOSIS — R928 Other abnormal and inconclusive findings on diagnostic imaging of breast: Secondary | ICD-10-CM | POA: Diagnosis not present

## 2021-10-01 DIAGNOSIS — N6011 Diffuse cystic mastopathy of right breast: Secondary | ICD-10-CM | POA: Diagnosis not present

## 2021-10-13 DIAGNOSIS — R928 Other abnormal and inconclusive findings on diagnostic imaging of breast: Secondary | ICD-10-CM | POA: Diagnosis not present

## 2021-10-13 DIAGNOSIS — D241 Benign neoplasm of right breast: Secondary | ICD-10-CM | POA: Diagnosis not present

## 2021-11-02 DIAGNOSIS — D649 Anemia, unspecified: Secondary | ICD-10-CM | POA: Diagnosis not present

## 2021-11-02 DIAGNOSIS — D508 Other iron deficiency anemias: Secondary | ICD-10-CM | POA: Diagnosis not present

## 2021-11-02 DIAGNOSIS — M450 Ankylosing spondylitis of multiple sites in spine: Secondary | ICD-10-CM | POA: Diagnosis not present

## 2021-11-02 DIAGNOSIS — M459 Ankylosing spondylitis of unspecified sites in spine: Secondary | ICD-10-CM | POA: Diagnosis not present

## 2021-11-02 DIAGNOSIS — D472 Monoclonal gammopathy: Secondary | ICD-10-CM | POA: Diagnosis not present

## 2021-11-05 ENCOUNTER — Ambulatory Visit: Payer: Self-pay | Admitting: Surgery

## 2021-11-05 DIAGNOSIS — D241 Benign neoplasm of right breast: Secondary | ICD-10-CM | POA: Diagnosis not present

## 2021-11-05 DIAGNOSIS — D242 Benign neoplasm of left breast: Secondary | ICD-10-CM

## 2021-11-22 NOTE — Progress Notes (Signed)
Surgical Instructions    Your procedure is scheduled on Monday, June 19th, 2023.   Report to [facility] Main Entrance "A" at 05:30 A.M., then check in with the Admitting office.  Call this number if you have problems the morning of surgery:  [phone]   If you have any questions prior to your surgery date call [phone]: Open Monday-Friday 8am-4pm    Remember:  Do not eat after midnight the night before your surgery  You may drink clear liquids until 04:30 the morning of your surgery.   Clear liquids allowed are: Water, Non-Citrus Juices (without pulp), Carbonated Beverages, Clear Tea, Black Coffee ONLY (NO MILK, CREAM OR POWDERED CREAMER of any kind), and Gatorade    Take these medicines the morning of surgery with A SIP OF WATER:   atorvastatin (LIPITOR) ezetimibe (ZETIA)   If needed:  acetaminophen (TYLENOL) diphenhydrAMINE (BENADRYL)   As of today, STOP taking any Aspirin (unless otherwise instructed by your surgeon) Aleve, Naproxen, Ibuprofen, Motrin, Advil, Goody's, BC's, all herbal medications, fish oil, and all vitamins.   The day of surgery:           Do not wear jewelry or makeup Do not wear lotions, powders, perfumes, or deodorant. Do not shave 48 hours prior to surgery.   Do not bring valuables to the hospital. Do not wear nail polish, gel polish, artificial nails, or any other type of covering on natural nails (fingers and toes) If you have artificial nails or gel coating that need to be removed by a nail salon, please have this removed prior to surgery. Artificial nails or gel coating may interfere with anesthesia's ability to adequately monitor your vital signs.  [facility] is not responsible for any belongings or valuables. .   Do NOT Smoke (Tobacco/Vaping)  24 hours prior to your procedure  If you use a CPAP at night, you may bring your mask for your overnight stay.   Contacts, glasses, hearing aids, dentures or partials may not be worn into  surgery, please bring cases for these belongings   For patients admitted to the hospital, discharge time will be determined by your treatment team.   Patients discharged the day of surgery will not be allowed to drive home, and someone needs to stay with them for 24 hours.   SURGICAL WAITING ROOM VISITATION Patients having surgery or a procedure in a hospital may have two support people. Children under the age of 50 must have an adult with them who is not the patient. They may stay in the waiting area during the procedure and may switch out with other visitors. If the patient needs to stay at the hospital during part of their recovery, the visitor guidelines for inpatient rooms apply.  Please refer to the [facility] website for the visitor guidelines for Inpatients (after your surgery is over and you are in a regular room).    Special instructions:    Oral Hygiene is also important to reduce your risk of infection.  Remember - BRUSH YOUR TEETH THE MORNING OF SURGERY WITH YOUR REGULAR TOOTHPASTE   [facility]- Preparing For Surgery  Before surgery, you can play an important role. Because skin is not sterile, your skin needs to be as free of germs as possible. You can reduce the number of germs on your skin by washing with CHG (chlorahexidine gluconate) Soap before surgery.  CHG is an antiseptic cleaner which kills germs and bonds with the skin to continue killing germs even after washing.  Please do not use if you have an allergy to CHG or antibacterial soaps. If your skin becomes reddened/irritated stop using the CHG.  Do not shave (including legs and underarms) for at least 48 hours prior to first CHG shower. It is OK to shave your face.  Please follow these instructions carefully.     Shower the NIGHT BEFORE SURGERY and the MORNING OF SURGERY with CHG Soap.   If you chose to wash your hair, wash your hair first as usual with your normal shampoo. After you shampoo, rinse your hair  and body thoroughly to remove the shampoo.  Then [employer] and genitals (private parts) with your normal soap and rinse thoroughly to remove soap.  After that Use CHG Soap as you would any other liquid soap. You can apply CHG directly to the skin and wash gently with a scrungie or a clean washcloth.   Apply the CHG Soap to your body ONLY FROM THE NECK DOWN.  Do not use on open wounds or open sores. Avoid contact with your eyes, ears, mouth and genitals (private parts). Wash Face and genitals (private parts)  with your normal soap.   Wash thoroughly, paying special attention to the area where your surgery will be performed.  Thoroughly rinse your body with warm water from the neck down.  DO NOT shower/wash with your normal soap after using and rinsing off the CHG Soap.  Pat yourself dry with a CLEAN TOWEL.  Wear CLEAN PAJAMAS to bed the night before surgery  Place CLEAN SHEETS on your bed the night before your surgery  DO NOT SLEEP WITH PETS.   Day of Surgery:  Take a shower with CHG soap. Wear Clean/Comfortable clothing the morning of surgery Do not apply any deodorants/lotions.   Remember to brush your teeth WITH YOUR REGULAR TOOTHPASTE.    If you received a COVID test during your pre-op visit, it is requested that you wear a mask when out in public, stay away from anyone that may not be feeling well, and notify your surgeon if you develop symptoms. If you have been in contact with anyone that has tested positive in the last 10 days, please notify your surgeon.    Please read over the following fact sheets that you were given.

## 2021-11-23 ENCOUNTER — Encounter (HOSPITAL_COMMUNITY)
Admission: RE | Admit: 2021-11-23 | Discharge: 2021-11-23 | Disposition: A | Discharge status: Home / Self Care | Payer: Medicare Other | Source: Ambulatory Visit | Attending: Surgery | Admitting: Surgery

## 2021-11-23 ENCOUNTER — Other Ambulatory Visit: Payer: Self-pay

## 2021-11-23 ENCOUNTER — Encounter (HOSPITAL_COMMUNITY): Payer: Self-pay

## 2021-11-23 VITALS — BP 130/87 | HR 83 | Temp 98.3°F | Resp 18 | Ht 61.0 in | Wt 177.1 lb

## 2021-11-23 DIAGNOSIS — M797 Fibromyalgia: Secondary | ICD-10-CM | POA: Insufficient documentation

## 2021-11-23 DIAGNOSIS — I10 Essential (primary) hypertension: Secondary | ICD-10-CM | POA: Diagnosis not present

## 2021-11-23 DIAGNOSIS — D241 Benign neoplasm of right breast: Secondary | ICD-10-CM | POA: Diagnosis not present

## 2021-11-23 DIAGNOSIS — G4733 Obstructive sleep apnea (adult) (pediatric): Secondary | ICD-10-CM | POA: Diagnosis not present

## 2021-11-23 DIAGNOSIS — Z01818 Encounter for other preprocedural examination: Secondary | ICD-10-CM | POA: Insufficient documentation

## 2021-11-23 DIAGNOSIS — M459 Ankylosing spondylitis of unspecified sites in spine: Secondary | ICD-10-CM | POA: Insufficient documentation

## 2021-11-23 HISTORY — DX: Monoclonal gammopathy: D47.2

## 2021-11-23 HISTORY — DX: Essential (primary) hypertension: I10

## 2021-11-23 HISTORY — DX: Ankylosing spondylitis of unspecified sites in spine: M45.9

## 2021-11-23 LAB — CBC
HCT: 33 % — ABNORMAL LOW (ref 36.0–46.0)
Hemoglobin: 10.3 g/dL — ABNORMAL LOW (ref 12.0–15.0)
MCH: 28.9 pg (ref 26.0–34.0)
MCHC: 31.2 g/dL (ref 30.0–36.0)
MCV: 92.4 fL (ref 80.0–100.0)
Platelets: 109 10*3/uL — ABNORMAL LOW (ref 150–400)
RBC: 3.57 MIL/uL — ABNORMAL LOW (ref 3.87–5.11)
RDW: 12.7 % (ref 11.5–15.5)
WBC: 6 10*3/uL (ref 4.0–10.5)
nRBC: 0 % (ref 0.0–0.2)

## 2021-11-23 NOTE — Progress Notes (Signed)
PCP - Darreld Mclean, MD Cardiologist - denies Oncologist - Leeanne Rio, MD  PPM/ICD - denies Device Orders - n/a Rep Notified - n/a  Chest x-ray - n/a EKG - 11/23/2021 Stress Test - 06/18/2014 ECHO - 06/17/2014 Cardiac Cath - denies  Sleep Study - yes, positive for OSA CPAP -   Fasting Blood Sugar - n/a  Blood Thinner Instructions: n/a  Aspirin Instructions: Patient was instructed: As of today, STOP taking any Aspirin (unless otherwise instructed by your surgeon) Aleve, Naproxen, Ibuprofen, Motrin, Advil, Goody's, BC's, all herbal medications, fish oil, and all vitamins.  ERAS Protcol - yes, until 04:30 o'clock  COVID TEST- n/a   Anesthesia review: yes - patient will have seed implantation on 11/26/21  Patient denies shortness of breath, fever, cough and chest pain at PAT appointment   All instructions explained to the patient, with a verbal understanding of the material. Patient agrees to go over the instructions while at home for a better understanding. Patient also instructed to self quarantine after being tested for COVID-19. The opportunity to ask questions was provided.

## 2021-11-24 ENCOUNTER — Encounter (HOSPITAL_COMMUNITY): Payer: Self-pay

## 2021-11-24 NOTE — Anesthesia Preprocedure Evaluation (Addendum)
Anesthesia Evaluation  Patient identified by MRN, date of birth, ID band Patient awake    Reviewed: Allergy & Precautions, NPO status , Patient's Chart, lab work & pertinent test results  History of Anesthesia Complications Negative for: history of anesthetic complications  Airway Mallampati: II  TM Distance: >3 FB Neck ROM: Full    Dental  (+) Edentulous Upper, Missing, Dental Advisory Given   Pulmonary sleep apnea and Continuous Positive Airway Pressure Ventilation ,    Pulmonary exam normal        Cardiovascular hypertension, Pt. on medications Normal cardiovascular exam   Echo 2016: EF 60-65%, no RWMA, g2dd Normal nuclear stress test 2016.   Neuro/Psych negative neurological ROS     GI/Hepatic negative GI ROS, Neg liver ROS,   Endo/Other  negative endocrine ROS  Renal/GU negative Renal ROS  negative genitourinary   Musculoskeletal  (+) Arthritis , Fibromyalgia -Ankylosing spondylitis   Abdominal   Peds  Hematology  (+) Blood dyscrasia, anemia ,   Anesthesia Other Findings IgA monoclonal gammopathy  Reproductive/Obstetrics                           Anesthesia Physical Anesthesia Plan  ASA: 2  Anesthesia Plan: General   Post-op Pain Management: Tylenol PO (pre-op)* and Toradol IV (intra-op)*   Induction: Intravenous  PONV Risk Score and Plan: 3 and Ondansetron, Dexamethasone, Midazolam and Treatment may vary due to age or medical condition  Airway Management Planned: LMA  Additional Equipment: None  Intra-op Plan:   Post-operative Plan: Extubation in OR  Informed Consent: I have reviewed the patients History and Physical, chart, labs and discussed the procedure including the risks, benefits and alternatives for the proposed anesthesia with the patient or authorized representative who has indicated his/her understanding and acceptance.     Dental advisory given  Plan  Discussed with:   Anesthesia Plan Comments: (PAT note written 11/24/2021 by Myra Gianotti, PA-C. )      Anesthesia Quick Evaluation

## 2021-11-24 NOTE — Progress Notes (Signed)
Anesthesia Chart Review:  Case: [id number] Date/Time: 11/29/21 0715   Procedure: RIGHT BREAST LUMPECTOMY WITH RADIOACTIVE SEED LOCALIZATION (Right: Breast)   Anesthesia type: General   Pre-op diagnosis: PAPILLOMA OF RIGHT BREAST   Location: [facility] OR ROOM 01 / [facility]   Surgeons: Erroll Luna, MD       DISCUSSION: Patient is a 71 year old female scheduled for the above procedure.  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  BMI is consistent with obesity.  Most recent labs and EKG reviewed.  RSL scheduled for 11/26/2021.  Anesthesia team to evaluate on the day of surgery.  VS: BP 130/87   Pulse 83   Temp 36.8 C (Oral)   Resp 18   Ht 5' 1"  (1.549 m)   Wt 80.3 kg   SpO2 100%   BMI 33.46 kg/m    PROVIDERS: Harlan Stains, MD is PCP - Cruzita Lederer, Vallathucherry, MD is HEM-ONC ([facility]).  Last visit 11/02/2021.  Continue active surveillance for IgA monoclonal gammopathy. - Gavin Pound, MD is rheumatologist - She is not followed routinely by cardiology. She did see Jenkins Rouge, MD in 05/2014 and had a normal stress test. Echo showed LVEF 60-65%, grade II diastolic dysfunction.    LABS: Labs from 11/23/21 and 11/02/21 ([place]) reviewed.  (all labs ordered are listed, but only abnormal results are displayed)  Labs Reviewed  CBC - Abnormal; Notable for the following components:      Result Value   RBC 3.57 (*)    Hemoglobin 10.3 (*)    HCT 33.0 (*)    Platelets 109 (*)    All other components within normal limits   She had a CMMET on 11/02/21 at [facility] (see Care Everywhere) that showed sodium 138, potassium 4.4, BUN 11, glucose 85, creatinine 1.04, calcium 9.7, albumin 3.9, total bilirubin 0.5, alkaline phosphatase 108, AST 29, ALT 24, eGFR 58.   EKG: 11/23/21: Normal sinus rhythm with sinus arrhythmia Normal ECG No  significant change since last tracing Confirmed by Skeet Latch [phone]) on 11/23/2021 10:58:36 AM   CV: Echo 06/17/14: Study Conclusions  - Left ventricle: The cavity size was normal. Wall thickness was    normal. Systolic function was normal. The estimated ejection    fraction was in the range of 60% to 65%. Wall motion was normal;    there were no regional wall motion abnormalities. Features are    consistent with a pseudonormal left ventricular filling pattern,    with concomitant abnormal relaxation and increased filling    pressure (grade 2 diastolic dysfunction).  - Left atrium: The atrium was mildly dilated.    Nuclear stress test 06/17/14: Overall Impression:  Normal stress nuclear study. LV Ejection Fraction: 58%.  LV Wall Motion:  Normal Wall Motion    Past Medical History:  Diagnosis Date   Allergy    Anemia    Ankylosing spondylitis ([place])    Colon polyps 2013   Fibromyalgia 2015   High cholesterol    Hypertension    IgA monoclonal gammopathy    Obesity 2005   Sleep apnea    sometimes wear CPAP   Urinary tract infection 2015   Urticaria     Past Surgical History:  Procedure Laterality Date   ABDOMINAL HYSTERECTOMY     BREAST SURGERY     cyst removal, right   COLONOSCOPY     ESOPHAGEAL MANOMETRY N/A 02/08/2016   Procedure: ESOPHAGEAL  MANOMETRY (EM);  Surgeon: Milus Banister, MD;  Location: [facility];  Service: Endoscopy;  Laterality: N/A;   HERNIA MESH REMOVAL     POLYPECTOMY     SHOULDER SURGERY Right     MEDICATIONS:  acetaminophen (TYLENOL) 500 MG tablet   atorvastatin (LIPITOR) 80 MG tablet   diphenhydrAMINE (BENADRYL) 25 MG tablet   ezetimibe (ZETIA) 10 MG tablet   losartan (COZAAR) 25 MG tablet   Multiple Vitamins-Minerals (CENTRUM SILVER 50+WOMEN) TABS   No current facility-administered medications for this encounter.    Myra Gianotti, PA-C Surgical Short Stay/Anesthesiology [facility] Phone [phone] [facility] Phone [phone] 11/24/2021 11:51 AM

## 2021-11-25 DIAGNOSIS — D241 Benign neoplasm of right breast: Secondary | ICD-10-CM | POA: Diagnosis not present

## 2021-11-29 ENCOUNTER — Ambulatory Visit (HOSPITAL_COMMUNITY): Payer: Medicare Other | Admitting: Vascular Surgery

## 2021-11-29 ENCOUNTER — Ambulatory Visit (HOSPITAL_COMMUNITY)
Admission: RE | Admit: 2021-11-29 | Discharge: 2021-11-29 | Disposition: A | Discharge status: Home / Self Care | Payer: Medicare Other | Attending: Surgery | Admitting: Surgery

## 2021-11-29 ENCOUNTER — Encounter (HOSPITAL_COMMUNITY): Payer: Self-pay | Admitting: Surgery

## 2021-11-29 ENCOUNTER — Other Ambulatory Visit: Payer: Self-pay

## 2021-11-29 ENCOUNTER — Ambulatory Visit (HOSPITAL_BASED_OUTPATIENT_CLINIC_OR_DEPARTMENT_OTHER): Payer: Medicare Other | Admitting: Anesthesiology

## 2021-11-29 ENCOUNTER — Encounter (HOSPITAL_COMMUNITY)
Admission: RE | Disposition: A | Discharge status: Home / Self Care | Payer: Self-pay | Source: Home / Self Care | Attending: Surgery

## 2021-11-29 DIAGNOSIS — D241 Benign neoplasm of right breast: Secondary | ICD-10-CM | POA: Insufficient documentation

## 2021-11-29 DIAGNOSIS — N6081 Other benign mammary dysplasias of right breast: Secondary | ICD-10-CM | POA: Insufficient documentation

## 2021-11-29 DIAGNOSIS — Z01818 Encounter for other preprocedural examination: Secondary | ICD-10-CM

## 2021-11-29 DIAGNOSIS — I10 Essential (primary) hypertension: Secondary | ICD-10-CM | POA: Insufficient documentation

## 2021-11-29 DIAGNOSIS — N6011 Diffuse cystic mastopathy of right breast: Secondary | ICD-10-CM | POA: Diagnosis not present

## 2021-11-29 DIAGNOSIS — G4733 Obstructive sleep apnea (adult) (pediatric): Secondary | ICD-10-CM

## 2021-11-29 DIAGNOSIS — G4730 Sleep apnea, unspecified: Secondary | ICD-10-CM | POA: Insufficient documentation

## 2021-11-29 DIAGNOSIS — R928 Other abnormal and inconclusive findings on diagnostic imaging of breast: Secondary | ICD-10-CM | POA: Diagnosis not present

## 2021-11-29 DIAGNOSIS — Z79899 Other long term (current) drug therapy: Secondary | ICD-10-CM | POA: Diagnosis not present

## 2021-11-29 DIAGNOSIS — Z9989 Dependence on other enabling machines and devices: Secondary | ICD-10-CM

## 2021-11-29 HISTORY — PX: BREAST LUMPECTOMY WITH RADIOACTIVE SEED LOCALIZATION: SHX6424

## 2021-11-29 SURGERY — BREAST LUMPECTOMY WITH RADIOACTIVE SEED LOCALIZATION
Anesthesia: General | Site: Breast | Laterality: Right

## 2021-11-29 MED ORDER — MIDAZOLAM HCL 2 MG/2ML IJ SOLN
INTRAMUSCULAR | Status: DC | PRN
  Administered 2021-11-29: 2 mg via INTRAVENOUS

## 2021-11-29 MED ORDER — LACTATED RINGERS IV SOLN: INTRAVENOUS | Status: DC

## 2021-11-29 MED ORDER — OXYCODONE HCL 5 MG/5ML PO SOLN: 5.0000 mg | Freq: Once | ORAL | Status: DC | PRN

## 2021-11-29 MED ORDER — CEFAZOLIN SODIUM-DEXTROSE 2-4 GM/100ML-% IV SOLN
2.0000 g | INTRAVENOUS | Status: AC
  Administered 2021-11-29: 2 g via INTRAVENOUS
  Filled 2021-11-29: qty 100

## 2021-11-29 MED ORDER — ONDANSETRON HCL 4 MG/2ML IJ SOLN: 4.0000 mg | Freq: Once | INTRAMUSCULAR | Status: DC | PRN

## 2021-11-29 MED ORDER — LIDOCAINE 2% (20 MG/ML) 5 ML SYRINGE
INTRAMUSCULAR | Status: DC | PRN
  Administered 2021-11-29: 100 mg via INTRAVENOUS

## 2021-11-29 MED ORDER — BUPIVACAINE-EPINEPHRINE 0.25% -1:200000 IJ SOLN
INTRAMUSCULAR | Status: DC | PRN
  Administered 2021-11-29: 30 mL

## 2021-11-29 MED ORDER — DEXAMETHASONE SODIUM PHOSPHATE 10 MG/ML IJ SOLN
INTRAMUSCULAR | Status: AC
  Filled 2021-11-29: qty 1

## 2021-11-29 MED ORDER — OXYCODONE HCL 5 MG PO TABS: 5.0000 mg | ORAL_TABLET | Freq: Once | ORAL | Status: DC | PRN

## 2021-11-29 MED ORDER — FENTANYL CITRATE (PF) 250 MCG/5ML IJ SOLN
INTRAMUSCULAR | Status: DC | PRN
  Administered 2021-11-29 (×2): 25 ug via INTRAVENOUS

## 2021-11-29 MED ORDER — LACTATED RINGERS IV SOLN: INTRAVENOUS | Status: DC | PRN

## 2021-11-29 MED ORDER — IBUPROFEN 800 MG PO TABS: 800.0000 mg | ORAL_TABLET | Freq: Three times a day (TID) | ORAL | 0 refills | Status: AC | PRN

## 2021-11-29 MED ORDER — MIDAZOLAM HCL 2 MG/2ML IJ SOLN
INTRAMUSCULAR | Status: AC
Start: 2021-11-29 — End: ?
  Filled 2021-11-29: qty 2

## 2021-11-29 MED ORDER — KETOROLAC TROMETHAMINE 30 MG/ML IJ SOLN
INTRAMUSCULAR | Status: DC | PRN
  Administered 2021-11-29: 30 mg via INTRAVENOUS

## 2021-11-29 MED ORDER — DEXAMETHASONE SODIUM PHOSPHATE 10 MG/ML IJ SOLN
INTRAMUSCULAR | Status: DC | PRN
  Administered 2021-11-29: 10 mg via INTRAVENOUS

## 2021-11-29 MED ORDER — ACETAMINOPHEN 500 MG PO TABS
1000.0000 mg | ORAL_TABLET | Freq: Once | ORAL | Status: AC
  Administered 2021-11-29: 1000 mg via ORAL
  Filled 2021-11-29: qty 2

## 2021-11-29 MED ORDER — LIDOCAINE 2% (20 MG/ML) 5 ML SYRINGE
INTRAMUSCULAR | Status: AC
  Filled 2021-11-29: qty 5

## 2021-11-29 MED ORDER — ONDANSETRON HCL 4 MG/2ML IJ SOLN
INTRAMUSCULAR | Status: AC
Start: 2021-11-29 — End: ?
  Filled 2021-11-29: qty 2

## 2021-11-29 MED ORDER — OXYCODONE HCL 5 MG PO TABS: 5.0000 mg | ORAL_TABLET | Freq: Four times a day (QID) | ORAL | 0 refills | Status: AC | PRN

## 2021-11-29 MED ORDER — ONDANSETRON HCL 4 MG/2ML IJ SOLN
INTRAMUSCULAR | Status: DC | PRN
  Administered 2021-11-29: 4 mg via INTRAVENOUS

## 2021-11-29 MED ORDER — BUPIVACAINE-EPINEPHRINE (PF) 0.25% -1:200000 IJ SOLN
INTRAMUSCULAR | Status: AC
  Filled 2021-11-29: qty 30

## 2021-11-29 MED ORDER — PROPOFOL 10 MG/ML IV BOLUS
INTRAVENOUS | Status: DC | PRN
  Administered 2021-11-29: 30 mg via INTRAVENOUS
  Administered 2021-11-29: 150 mg via INTRAVENOUS

## 2021-11-29 MED ORDER — KETOROLAC TROMETHAMINE 30 MG/ML IJ SOLN
INTRAMUSCULAR | Status: AC
  Filled 2021-11-29: qty 2

## 2021-11-29 MED ORDER — PROPOFOL 10 MG/ML IV BOLUS
INTRAVENOUS | Status: AC
Start: 2021-11-29 — End: ?
  Filled 2021-11-29: qty 20

## 2021-11-29 MED ORDER — 0.9 % SODIUM CHLORIDE (POUR BTL) OPTIME
TOPICAL | Status: DC | PRN
  Administered 2021-11-29: 1000 mL

## 2021-11-29 MED ORDER — CHLORHEXIDINE GLUCONATE CLOTH 2 % EX PADS: 6.0000 | MEDICATED_PAD | Freq: Once | CUTANEOUS | Status: DC

## 2021-11-29 MED ORDER — AMISULPRIDE (ANTIEMETIC) 5 MG/2ML IV SOLN: 10.0000 mg | Freq: Once | INTRAVENOUS | Status: DC | PRN

## 2021-11-29 MED ORDER — DOUBLE ANTIBIOTIC 500-10000 UNIT/GM EX OINT
TOPICAL_OINTMENT | CUTANEOUS | Status: AC
  Filled 2021-11-29: qty 28.4

## 2021-11-29 MED ORDER — CHLORHEXIDINE GLUCONATE 0.12 % MT SOLN
15.0000 mL | Freq: Once | OROMUCOSAL | Status: AC
  Administered 2021-11-29: 15 mL via OROMUCOSAL
  Filled 2021-11-29: qty 15

## 2021-11-29 MED ORDER — ORAL CARE MOUTH RINSE: 15.0000 mL | Freq: Once | OROMUCOSAL | Status: AC

## 2021-11-29 MED ORDER — FENTANYL CITRATE (PF) 100 MCG/2ML IJ SOLN: 25.0000 ug | INTRAMUSCULAR | Status: DC | PRN

## 2021-11-29 MED ORDER — FENTANYL CITRATE (PF) 250 MCG/5ML IJ SOLN
INTRAMUSCULAR | Status: AC
  Filled 2021-11-29: qty 5

## 2021-11-29 SURGICAL SUPPLY — 43 items
ADH SKN CLS APL DERMABOND .7 (GAUZE/BANDAGES/DRESSINGS) ×1
APL PRP STRL LF DISP 70% ISPRP (MISCELLANEOUS) ×1
APPLIER CLIP 9.375 MED OPEN (MISCELLANEOUS)
APR CLP MED 9.3 20 MLT OPN (MISCELLANEOUS)
BAG COUNTER SPONGE SURGICOUNT (BAG) ×2 IMPLANT
BAG SPNG CNTER NS LX DISP (BAG) ×1
BINDER BREAST LRG (GAUZE/BANDAGES/DRESSINGS) IMPLANT
BINDER BREAST XLRG (GAUZE/BANDAGES/DRESSINGS) ×1 IMPLANT
CANISTER SUCT 3000ML PPV (MISCELLANEOUS) IMPLANT
CHLORAPREP W/TINT 26 (MISCELLANEOUS) ×2 IMPLANT
CLIP APPLIE 9.375 MED OPEN (MISCELLANEOUS) IMPLANT
COVER PROBE W GEL 5X96 (DRAPES) ×2 IMPLANT
COVER SURGICAL LIGHT HANDLE (MISCELLANEOUS) ×2 IMPLANT
DERMABOND ADVANCED (GAUZE/BANDAGES/DRESSINGS) ×1
DERMABOND ADVANCED .7 DNX12 (GAUZE/BANDAGES/DRESSINGS) ×1 IMPLANT
DEVICE DUBIN SPECIMEN MAMMOGRA (MISCELLANEOUS) ×2 IMPLANT
DRAPE CHEST BREAST 15X10 FENES (DRAPES) ×2 IMPLANT
ELECT CAUTERY BLADE 6.4 (BLADE) ×2 IMPLANT
ELECT REM PT RETURN 9FT ADLT (ELECTROSURGICAL) ×2
ELECTRODE REM PT RTRN 9FT ADLT (ELECTROSURGICAL) ×1 IMPLANT
GLOVE BIO SURGEON STRL SZ8 (GLOVE) ×2 IMPLANT
GLOVE BIOGEL PI IND STRL 8 (GLOVE) ×1 IMPLANT
GLOVE BIOGEL PI INDICATOR 8 (GLOVE) ×1
GOWN STRL REUS W/ TWL LRG LVL3 (GOWN DISPOSABLE) ×1 IMPLANT
GOWN STRL REUS W/ TWL XL LVL3 (GOWN DISPOSABLE) ×1 IMPLANT
GOWN STRL REUS W/TWL LRG LVL3 (GOWN DISPOSABLE) ×2
GOWN STRL REUS W/TWL XL LVL3 (GOWN DISPOSABLE) ×2
KIT BASIN OR (CUSTOM PROCEDURE TRAY) ×2 IMPLANT
KIT MARKER MARGIN INK (KITS) IMPLANT
LIGHT WAVEGUIDE WIDE FLAT (MISCELLANEOUS) IMPLANT
NDL HYPO 25GX1X1/2 BEV (NEEDLE) IMPLANT
NEEDLE HYPO 25GX1X1/2 BEV (NEEDLE) IMPLANT
NS IRRIG 1000ML POUR BTL (IV SOLUTION) IMPLANT
PACK GENERAL/GYN (CUSTOM PROCEDURE TRAY) ×2 IMPLANT
SUT MNCRL AB 4-0 PS2 18 (SUTURE) ×2 IMPLANT
SUT SILK 2 0 SH (SUTURE) IMPLANT
SUT VIC AB 2-0 SH 27 (SUTURE)
SUT VIC AB 2-0 SH 27XBRD (SUTURE) IMPLANT
SUT VIC AB 3-0 SH 27 (SUTURE)
SUT VIC AB 3-0 SH 27X BRD (SUTURE) IMPLANT
SUT VIC AB 3-0 SH 8-18 (SUTURE) ×2 IMPLANT
SYR CONTROL 10ML LL (SYRINGE) IMPLANT
TOWEL GREEN STERILE FF (TOWEL DISPOSABLE) IMPLANT

## 2021-11-29 NOTE — Transfer of Care (Signed)
Immediate Anesthesia Transfer of Care Note  Patient: Lindsay Bass  Procedure(s) Performed: RIGHT BREAST LUMPECTOMY WITH RADIOACTIVE SEED LOCALIZATION (Right: Breast)  Patient Location: PACU  Anesthesia Type:General  Level of Consciousness: awake and drowsy  Airway & Oxygen Therapy: Patient Spontanous Breathing  Post-op Assessment: Report given to RN, Post -op Vital signs reviewed and stable and Patient moving all extremities X 4  Post vital signs: Reviewed and stable  Last Vitals:  Vitals Value Taken Time  BP 109/66   Temp    Pulse 83 11/29/21 0833  Resp 15 11/29/21 0833  SpO2 100 % 11/29/21 0833  Vitals shown include unvalidated device data.  Last Pain:  Vitals:   11/29/21 0558  TempSrc:   PainSc: 0-No pain         Complications: No notable events documented.

## 2021-11-29 NOTE — Op Note (Signed)
Preoperative diagnosis: Right breast papilloma upper outer quadrant  Postoperative diagnosis: Same  Procedure: Right breast seed localized lumpectomy  Surgeon: Erroll Luna, MD  Anesthesia: LMA with 0.25% Marcaine plain  EBL: Minimal  Specimen: Right breast tissue with seed and clip verified by Faxitron  Drains: None  IV fluids: Per anesthesia record  Indications for procedure: The patient is a pleasant 71 year old female with a mammographic abnormality in her right breast.  Core biopsy showed papilloma.  We discussed the pros and cons of removal as well as watchful waiting.  We discussed potential risk of upgrade to malignancy or atypical lesion and is being anywhere from 2 to 3% up to 10% literature.  After discussion of the above she wished to have the area removed.The procedure has been discussed with the patient. Alternatives to surgery have been discussed with the patient.  Risks of surgery include bleeding,  Infection,  Seroma formation, death,  and the need for further surgery.   The patient understands and wishes to proceed.    Description of procedure: The patient was met in the holding area and questions were answered.  Right breast was marked as the correct site.  The seed was placed as an outpatient.  She was then taken back to the operating.  She is placed supine upon the OR table.  After induction of general anesthesia, right breast was prepped and draped in a sterile fashion and timeout performed.  Proper patient, site and procedure were verified.  Neoprobe used identify the seed right breast upper outer quadrant.  A curvilinear incision was made over the area.  Dissection was carried down until the seed and clip were encountered in all tissue around the seed and clip were excised with a grossly negative margin.  Hemostasis achieved with cautery.  The specimen was oriented with ink and passed off the field.  The Faxitron image revealed the seed and clip to be present.  The  cavities irrigated.  Is made hemostatic with cautery and local anesthetic was infiltrated throughout.  It was then closed with a deep layer 3-0 Vicryl.  4 Monocryl was used to close the skin in a subcuticular fashion.  Dermabond was applied.  All counts were found to be correct.  Patient was awoke extubated taken to recovery in satisfactory condition.

## 2021-11-29 NOTE — Anesthesia Postprocedure Evaluation (Signed)
Anesthesia Post Note  Patient: Lindsay Bass  Procedure(s) Performed: RIGHT BREAST LUMPECTOMY WITH RADIOACTIVE SEED LOCALIZATION (Right: Breast)     Patient location during evaluation: PACU Anesthesia Type: General Level of consciousness: awake and alert Pain management: pain level controlled Vital Signs Assessment: post-procedure vital signs reviewed and stable Respiratory status: spontaneous breathing, nonlabored ventilation and respiratory function stable Cardiovascular status: blood pressure returned to baseline and stable Postop Assessment: no apparent nausea or vomiting Anesthetic complications: no   No notable events documented.  Last Vitals:  Vitals:   11/29/21 0848 11/29/21 0903  BP: 119/74 116/73  Pulse: 74 67  Resp: 14 11  Temp:  (!) 36.3 C  SpO2: 99% 97%    Last Pain:  Vitals:   11/29/21 0903  TempSrc:   PainSc: Asleep                 Lidia Collum

## 2021-11-29 NOTE — Anesthesia Procedure Notes (Signed)
Procedure Name: LMA Insertion Date/Time: 11/29/2021 7:39 AM  Performed by: Harden Mo, CRNAPre-anesthesia Checklist: Patient identified, Emergency Drugs available, Suction available and Patient being monitored Patient Re-evaluated:Patient Re-evaluated prior to induction Oxygen Delivery Method: Circle System Utilized Preoxygenation: Pre-oxygenation with 100% oxygen Induction Type: IV induction Ventilation: Mask ventilation without difficulty LMA: LMA inserted LMA Size: 4.0 Number of attempts: 1 Airway Equipment and Method: Bite block Placement Confirmation: positive ETCO2 Tube secured with: Tape Dental Injury: Teeth and Oropharynx as per pre-operative assessment

## 2021-11-29 NOTE — Interval H&P Note (Signed)
History and Physical Interval Note:  11/29/2021 7:17 AM  Lindsay Bass  has presented today for surgery, with the diagnosis of PAPILLOMA OF RIGHT BREAST.  The various methods of treatment have been discussed with the patient and family. After consideration of risks, benefits and other options for treatment, the patient has consented to  Procedure(s): RIGHT BREAST LUMPECTOMY WITH RADIOACTIVE SEED LOCALIZATION (Right) as a surgical intervention.  The patient's history has been reviewed, patient examined, no change in status, stable for surgery.  I have reviewed the patient's chart and labs.  Questions were answered to the patient's satisfaction.     Turner Daniels MD

## 2021-11-29 NOTE — Discharge Instructions (Signed)
[facility] Office Phone Number [phone]  BREAST BIOPSY/ PARTIAL MASTECTOMY: POST OP INSTRUCTIONS  Always review your discharge instruction sheet given to you by the facility where your surgery was performed.  IF YOU HAVE DISABILITY OR FAMILY LEAVE FORMS, YOU MUST BRING THEM TO THE OFFICE FOR PROCESSING.  DO NOT GIVE THEM TO YOUR DOCTOR.  A prescription for pain medication may be given to you upon discharge.  Take your pain medication as prescribed, if needed.  If narcotic pain medicine is not needed, then you may take acetaminophen (Tylenol) or ibuprofen (Advil) as needed. Take your usually prescribed medications unless otherwise directed If you need a refill on your pain medication, please contact your pharmacy.  They will contact our office to request authorization.  Prescriptions will not be filled after 5pm or on week-ends. You should eat very light the first 24 hours after surgery, such as soup, crackers, pudding, etc.  Resume your normal diet the day after surgery. Most patients will experience some swelling and bruising in the breast.  Ice packs and a good support bra will help.  Swelling and bruising can take several days to resolve.  It is common to experience some constipation if taking pain medication after surgery.  Increasing fluid intake and taking a stool softener will usually help or prevent this problem from occurring.  A mild laxative (Milk of Magnesia or Miralax) should be taken according to package directions if there are no bowel movements after 48 hours. Unless discharge instructions indicate otherwise, you may remove your bandages 24-48 hours after surgery, and you may shower at that time.  You may have steri-strips (small skin tapes) in place directly over the incision.  These strips should be left on the skin for 7-10 days.  If your surgeon used skin glue on the incision, you may shower in 24 hours.  The glue will flake off over the next 2-3 weeks.  Any  sutures or staples will be removed at the office during your follow-up visit. ACTIVITIES:  You may resume regular daily activities (gradually increasing) beginning the next day.  Wearing a good support bra or sports bra minimizes pain and swelling.  You may have sexual intercourse when it is comfortable. You may drive when you no longer are taking prescription pain medication, you can comfortably wear a seatbelt, and you can safely maneuver your car and apply brakes. RETURN TO WORK:  ______________________________________________________________________________________ You should see your doctor in the office for a follow-up appointment approximately two weeks after your surgery.  Your doctor's nurse will typically make your follow-up appointment when she calls you with your pathology report.  Expect your pathology report 2-3 business days after your surgery.  You may call to check if you do not hear from us after three days. OTHER INSTRUCTIONS: _______________________________________________________________________________________________ _____________________________________________________________________________________________________________________________________ _____________________________________________________________________________________________________________________________________ _____________________________________________________________________________________________________________________________________  WHEN TO CALL YOUR DOCTOR: Fever over 101.0 Nausea and/or vomiting. Extreme swelling or bruising. Continued bleeding from incision. Increased pain, redness, or drainage from the incision.  The clinic staff is available to answer your questions during regular business hours.  Please don't hesitate to call and ask to speak to one of the nurses for clinical concerns.  If you have a medical emergency, go to the nearest emergency room or call 911.  A surgeon from [facility] is always on call at the hospital.  For further questions, please visit [email or web address]   

## 2021-11-29 NOTE — H&P (Signed)
Chief Complaint: Breast Problem   History of Present Illness: Lindsay Bass is a 71 y.o. female who is seen today as an office consultation for evaluation of Breast Problem .   Patient sent at the request of Dr. Truman Hayward from [facility] mammography due to abnormality right breast. Patient underwent screening mammogram which showed a density in the right breast upper outer quadrant. Core biopsy showed atypical papilloma. She is here today to discuss these findings and treatment options. No history of nipple discharge bilaterally. No history of breast masses but she does have fibrocystic change. No family history of breast cancer and only fibrocystic change.  Review of Systems: A complete review of systems was obtained from the patient. I have reviewed this information and discussed as appropriate with the patient. See HPI as well for other ROS.    Medical History: Past Medical History:  Diagnosis Date   Anemia   Arthritis   Hypertension   Sleep apnea   There is no problem list on file for this patient.  Past Surgical History:  Procedure Laterality Date   cyst removed from right breast   HYSTERECTOMY   rotator cuff surgery    No Known Allergies  Current Outpatient Medications on File Prior to Visit  Medication Sig Dispense Refill   atorvastatin (LIPITOR) 80 MG tablet atorvastatin 80 mg tablet   cholecalciferol (VITAMIN D3) 5,000 unit capsule Take by mouth   ezetimibe (ZETIA) 10 mg tablet ezetimibe 10 mg tablet   losartan (COZAAR) 25 MG tablet Take 1 tablet by mouth once daily   No current facility-administered medications on file prior to visit.   Family History  Problem Relation Age of Onset   Stroke Sister   Obesity Sister   High blood pressure (Hypertension) Sister   Hyperlipidemia (Elevated cholesterol) Sister   High blood pressure (Hypertension) Brother   Hyperlipidemia (Elevated cholesterol) Brother    Social History   Tobacco Use  Smoking Status Never  Smokeless  Tobacco Never    Social History   Socioeconomic History   Marital status: Married  Tobacco Use   Smoking status: Never   Smokeless tobacco: Never  Vaping Use   Vaping Use: Never used  Substance and Sexual Activity   Alcohol use: Never   Drug use: Never   Objective:   Vitals:  11/05/21 1034  BP: 128/74  Pulse: 98  Temp: 36.7 C (98 F)  SpO2: 99%  Weight: 80.1 kg (176 lb 9.6 oz)  Height: 154.9 cm ('5\' 1"'$ )   Body mass index is 33.37 kg/m.    General appearance - alert, well appearing, and in no distress Heart - normal rate, regular rhythm, normal S1, S2, no murmurs, rubs, clicks or gallops, not examined Breasts - breasts appear normal, no suspicious masses, no skin or nipple changes or axillary nodes Musculoskeletal - no joint tenderness, deformity or swelling  Labs, Imaging and Diagnostic Testing: Pathology revealed atypical right breast papilloma for outer quadrant  Mammogram revealed density upper outer quadrant right breast ill-defined  Assessment and Plan:   Diagnoses and all orders for this visit:  Papilloma of right breast    Discussed atypia and the need for excision in this circumstance given potential upgrade risk of 20% or higher. Discussed right breast seed localized lumpectomy as well as the expected recovery, complications, alternatives to surgery and future treatments. She is agreed to proceed with right breast seed localized lumpectomy for atypical papilloma. The procedure has been discussed with the patient. Alternatives to surgery have been  discussed with the patient. Risks of surgery include bleeding, cosmesis Infection, Seroma formation, death, and the need for further surgery. The patient understands and wishes to proceed.  No follow-ups on file.  Kennieth Francois, MD

## 2021-11-30 ENCOUNTER — Encounter (HOSPITAL_COMMUNITY): Payer: Self-pay | Admitting: Surgery

## 2021-11-30 LAB — SURGICAL PATHOLOGY

## 2021-12-01 ENCOUNTER — Encounter: Payer: Self-pay | Admitting: Surgery

## 2021-12-10 ENCOUNTER — Encounter (HOSPITAL_COMMUNITY): Payer: Self-pay

## 2022-02-15 DIAGNOSIS — Z780 Asymptomatic menopausal state: Secondary | ICD-10-CM | POA: Diagnosis not present

## 2022-02-23 DIAGNOSIS — M1712 Unilateral primary osteoarthritis, left knee: Secondary | ICD-10-CM | POA: Diagnosis not present

## 2022-02-23 DIAGNOSIS — M25562 Pain in left knee: Secondary | ICD-10-CM | POA: Diagnosis not present

## 2022-03-09 DIAGNOSIS — E538 Deficiency of other specified B group vitamins: Secondary | ICD-10-CM | POA: Diagnosis not present

## 2022-03-09 DIAGNOSIS — E785 Hyperlipidemia, unspecified: Secondary | ICD-10-CM | POA: Diagnosis not present

## 2022-03-09 DIAGNOSIS — I10 Essential (primary) hypertension: Secondary | ICD-10-CM | POA: Diagnosis not present

## 2022-03-09 DIAGNOSIS — Z1211 Encounter for screening for malignant neoplasm of colon: Secondary | ICD-10-CM | POA: Diagnosis not present

## 2022-03-09 DIAGNOSIS — D472 Monoclonal gammopathy: Secondary | ICD-10-CM | POA: Diagnosis not present

## 2022-03-09 DIAGNOSIS — E559 Vitamin D deficiency, unspecified: Secondary | ICD-10-CM | POA: Diagnosis not present

## 2022-03-09 DIAGNOSIS — Z23 Encounter for immunization: Secondary | ICD-10-CM | POA: Diagnosis not present

## 2022-03-09 DIAGNOSIS — J301 Allergic rhinitis due to pollen: Secondary | ICD-10-CM | POA: Diagnosis not present

## 2022-03-09 DIAGNOSIS — G4733 Obstructive sleep apnea (adult) (pediatric): Secondary | ICD-10-CM | POA: Diagnosis not present

## 2022-03-09 DIAGNOSIS — Z0000 Encounter for general adult medical examination without abnormal findings: Secondary | ICD-10-CM | POA: Diagnosis not present

## 2022-03-24 DIAGNOSIS — G4733 Obstructive sleep apnea (adult) (pediatric): Secondary | ICD-10-CM | POA: Diagnosis not present

## 2022-03-24 DIAGNOSIS — I10 Essential (primary) hypertension: Secondary | ICD-10-CM | POA: Diagnosis not present

## 2022-04-11 DIAGNOSIS — G4733 Obstructive sleep apnea (adult) (pediatric): Secondary | ICD-10-CM | POA: Diagnosis not present

## 2022-04-12 DIAGNOSIS — G4733 Obstructive sleep apnea (adult) (pediatric): Secondary | ICD-10-CM | POA: Diagnosis not present

## 2022-04-13 DIAGNOSIS — M1612 Unilateral primary osteoarthritis, left hip: Secondary | ICD-10-CM | POA: Diagnosis not present

## 2022-04-13 DIAGNOSIS — M7062 Trochanteric bursitis, left hip: Secondary | ICD-10-CM | POA: Diagnosis not present

## 2022-04-13 DIAGNOSIS — M1712 Unilateral primary osteoarthritis, left knee: Secondary | ICD-10-CM | POA: Diagnosis not present

## 2022-04-25 DIAGNOSIS — M1712 Unilateral primary osteoarthritis, left knee: Secondary | ICD-10-CM | POA: Diagnosis not present

## 2022-04-25 DIAGNOSIS — M7062 Trochanteric bursitis, left hip: Secondary | ICD-10-CM | POA: Diagnosis not present

## 2022-05-03 DIAGNOSIS — M1712 Unilateral primary osteoarthritis, left knee: Secondary | ICD-10-CM | POA: Diagnosis not present

## 2022-05-03 DIAGNOSIS — M7062 Trochanteric bursitis, left hip: Secondary | ICD-10-CM | POA: Diagnosis not present

## 2022-05-09 DIAGNOSIS — G4733 Obstructive sleep apnea (adult) (pediatric): Secondary | ICD-10-CM | POA: Diagnosis not present

## 2022-05-10 DIAGNOSIS — M1712 Unilateral primary osteoarthritis, left knee: Secondary | ICD-10-CM | POA: Diagnosis not present

## 2022-05-10 DIAGNOSIS — M7062 Trochanteric bursitis, left hip: Secondary | ICD-10-CM | POA: Diagnosis not present

## 2022-05-12 DIAGNOSIS — M7062 Trochanteric bursitis, left hip: Secondary | ICD-10-CM | POA: Diagnosis not present

## 2022-05-12 DIAGNOSIS — M1712 Unilateral primary osteoarthritis, left knee: Secondary | ICD-10-CM | POA: Diagnosis not present

## 2022-05-17 DIAGNOSIS — M7062 Trochanteric bursitis, left hip: Secondary | ICD-10-CM | POA: Diagnosis not present

## 2022-05-17 DIAGNOSIS — M1712 Unilateral primary osteoarthritis, left knee: Secondary | ICD-10-CM | POA: Diagnosis not present

## 2022-05-19 DIAGNOSIS — M7062 Trochanteric bursitis, left hip: Secondary | ICD-10-CM | POA: Diagnosis not present

## 2022-05-19 DIAGNOSIS — M1712 Unilateral primary osteoarthritis, left knee: Secondary | ICD-10-CM | POA: Diagnosis not present

## 2022-05-25 DIAGNOSIS — D472 Monoclonal gammopathy: Secondary | ICD-10-CM | POA: Diagnosis not present

## 2022-05-25 DIAGNOSIS — M459 Ankylosing spondylitis of unspecified sites in spine: Secondary | ICD-10-CM | POA: Diagnosis not present

## 2022-05-25 DIAGNOSIS — M450 Ankylosing spondylitis of multiple sites in spine: Secondary | ICD-10-CM | POA: Diagnosis not present

## 2022-05-25 DIAGNOSIS — D649 Anemia, unspecified: Secondary | ICD-10-CM | POA: Diagnosis not present

## 2022-05-26 DIAGNOSIS — M1712 Unilateral primary osteoarthritis, left knee: Secondary | ICD-10-CM | POA: Diagnosis not present

## 2022-05-26 DIAGNOSIS — M7062 Trochanteric bursitis, left hip: Secondary | ICD-10-CM | POA: Diagnosis not present

## 2022-05-27 ENCOUNTER — Encounter: Payer: Self-pay | Admitting: Gastroenterology

## 2022-05-31 DIAGNOSIS — M7062 Trochanteric bursitis, left hip: Secondary | ICD-10-CM | POA: Diagnosis not present

## 2022-05-31 DIAGNOSIS — M1712 Unilateral primary osteoarthritis, left knee: Secondary | ICD-10-CM | POA: Diagnosis not present

## 2022-06-08 DIAGNOSIS — G4733 Obstructive sleep apnea (adult) (pediatric): Secondary | ICD-10-CM | POA: Diagnosis not present

## 2022-06-23 DIAGNOSIS — M1712 Unilateral primary osteoarthritis, left knee: Secondary | ICD-10-CM | POA: Diagnosis not present

## 2022-06-29 DIAGNOSIS — R051 Acute cough: Secondary | ICD-10-CM | POA: Diagnosis not present

## 2022-07-09 DIAGNOSIS — G4733 Obstructive sleep apnea (adult) (pediatric): Secondary | ICD-10-CM | POA: Diagnosis not present

## 2022-07-22 ENCOUNTER — Encounter: Payer: Self-pay | Admitting: Gastroenterology

## 2022-07-22 ENCOUNTER — Ambulatory Visit (AMBULATORY_SURGERY_CENTER): Payer: Medicare Other | Admitting: *Deleted

## 2022-07-22 VITALS — Ht 61.0 in | Wt 172.0 lb

## 2022-07-22 DIAGNOSIS — Z1211 Encounter for screening for malignant neoplasm of colon: Secondary | ICD-10-CM

## 2022-07-22 MED ORDER — NA SULFATE-K SULFATE-MG SULF 17.5-3.13-1.6 GM/177ML PO SOLN: 1.0000 | Freq: Once | ORAL | 0 refills | Status: AC

## 2022-07-22 NOTE — Progress Notes (Signed)
No egg or soy allergy known to patient  No issues known to pt with past sedation with any surgeries or procedures Patient denies ever being told they had issues or difficulty with intubation  No FH of Malignant Hyperthermia Pt is not on diet pills Pt is not on  home 02  Pt is not on blood thinners  Pt denies issues with constipation  Pt is not on dialysis Pt denies any upcoming cardiac testing Pt encouraged to use to use Singlecare or Goodrx to reduce cost  Patient's chart reviewed by Osvaldo Angst CNRA prior to previsit and patient appropriate for the [place].  Previsit completed and red dot placed by patient's name on their procedure day (on provider's schedule).  . Visit by phone Instructions reviewed with pt and pt states understanding. Instructed to review again prior to procedure. Pt states they will.  Instructions sent by mail

## 2022-07-28 ENCOUNTER — Telehealth: Payer: Self-pay | Admitting: Gastroenterology

## 2022-07-28 NOTE — Telephone Encounter (Signed)
Returned pt call. All questions answered.

## 2022-07-28 NOTE — Telephone Encounter (Signed)
Inbound call from patient requesting a call back to discuss her prep instructions. Please advise.

## 2022-08-09 ENCOUNTER — Encounter: Payer: Self-pay | Admitting: Gastroenterology

## 2022-08-09 ENCOUNTER — Ambulatory Visit (AMBULATORY_SURGERY_CENTER): Payer: Medicare Other | Admitting: Gastroenterology

## 2022-08-09 VITALS — BP 104/50 | HR 65 | Temp 98.2°F | Resp 13 | Ht 61.0 in | Wt 172.0 lb

## 2022-08-09 DIAGNOSIS — Z1211 Encounter for screening for malignant neoplasm of colon: Secondary | ICD-10-CM | POA: Diagnosis not present

## 2022-08-09 DIAGNOSIS — D124 Benign neoplasm of descending colon: Secondary | ICD-10-CM

## 2022-08-09 DIAGNOSIS — D123 Benign neoplasm of transverse colon: Secondary | ICD-10-CM

## 2022-08-09 DIAGNOSIS — G4730 Sleep apnea, unspecified: Secondary | ICD-10-CM | POA: Diagnosis not present

## 2022-08-09 DIAGNOSIS — I10 Essential (primary) hypertension: Secondary | ICD-10-CM | POA: Diagnosis not present

## 2022-08-09 MED ORDER — SODIUM CHLORIDE 0.9 % IV SOLN: 500.0000 mL | Freq: Once | INTRAVENOUS | Status: DC

## 2022-08-09 NOTE — Patient Instructions (Signed)
Handouts provided on polyps, hemorrhoids and high fiber diet.   Recommend a high-fiber diet (see handout). Use FiberCon 1-2 tablets by mouth daily.  Continue present medications.  Await pathology results.  Repeat colonoscopy in 5-7 years for surveillance based on pathology results (and based on the patient's overall health at the time as she will be 77 and 79 at that time).   YOU HAD AN ENDOSCOPIC PROCEDURE TODAY AT [facility]:   Refer to the procedure report that was given to you for any specific questions about what was found during the examination.  If the procedure report does not answer your questions, please call your gastroenterologist to clarify.  If you requested that your care partner not be given the details of your procedure findings, then the procedure report has been included in a sealed envelope for you to review at your convenience later.  YOU SHOULD EXPECT: Some feelings of bloating in the abdomen. Passage of more gas than usual.  Walking can help get rid of the air that was put into your GI tract during the procedure and reduce the bloating. If you had a lower endoscopy (such as a colonoscopy or flexible sigmoidoscopy) you may notice spotting of blood in your stool or on the toilet paper. If you underwent a bowel prep for your procedure, you may not have a normal bowel movement for a few days.  Please Note:  You might notice some irritation and congestion in your nose or some drainage.  This is from the oxygen used during your procedure.  There is no need for concern and it should clear up in a day or so.  SYMPTOMS TO REPORT IMMEDIATELY:  Following lower endoscopy (colonoscopy or flexible sigmoidoscopy):  Excessive amounts of blood in the stool  Significant tenderness or worsening of abdominal pains  Swelling of the abdomen that is new, acute  Fever of 100F or higher  For urgent or emergent issues, a gastroenterologist can be reached at any hour by calling  [phone]. Do not use MyChart messaging for urgent concerns.    DIET:  We do recommend a small meal at first, but then you may proceed to your regular diet.  Drink plenty of fluids but you should avoid alcoholic beverages for 24 hours.  ACTIVITY:  You should plan to take it easy for the rest of today and you should NOT DRIVE or use heavy machinery until tomorrow (because of the sedation medicines used during the test).    FOLLOW UP: Our staff will call the number listed on your records the next business day following your procedure.  We will call around 7:15- 8:00 am to check on you and address any questions or concerns that you may have regarding the information given to you following your procedure. If we do not reach you, we will leave a message.     If any biopsies were taken you will be contacted by phone or by letter within the next 1-3 weeks.  Please call us at [phone] if you have not heard about the biopsies in 3 weeks.    SIGNATURES/CONFIDENTIALITY: You and/or your care partner have signed paperwork which will be entered into your electronic medical record.  These signatures attest to the fact that that the information above on your After Visit Summary has been reviewed and is understood.  Full responsibility of the confidentiality of this discharge information lies with you and/or your care-partner.

## 2022-08-09 NOTE — Progress Notes (Signed)
Called to room to assist during endoscopic procedure.  Patient ID and intended procedure confirmed with present staff. Received instructions for my participation in the procedure from the performing physician.  

## 2022-08-09 NOTE — Progress Notes (Signed)
Vss nad trans to pacu °

## 2022-08-09 NOTE — Progress Notes (Signed)
GASTROENTEROLOGY PROCEDURE H&P NOTE   Primary Care Physician: Harlan Stains, MD  HPI: Lindsay Bass is a 72 y.o. female who presents for Screening colonoscopy.  Past Medical History:  Diagnosis Date   Allergy    Anemia    Ankylosing spondylitis ([place])    Colon polyps 2013   Fibromyalgia 2015   High cholesterol    Hypertension    IgA monoclonal gammopathy    Obesity 2005   Sleep apnea    sometimes wear CPAP   Sleep apnea    Urinary tract infection 2015   Urticaria    Past Surgical History:  Procedure Laterality Date   ABDOMINAL HYSTERECTOMY     BREAST LUMPECTOMY WITH RADIOACTIVE SEED LOCALIZATION Right 11/29/2021   Procedure: RIGHT BREAST LUMPECTOMY WITH RADIOACTIVE SEED LOCALIZATION;  Surgeon: Erroll Luna, MD;  Location: [facility];  Service: General;  Laterality: Right;   BREAST SURGERY     cyst removal, right   COLONOSCOPY     ESOPHAGEAL MANOMETRY N/A 02/08/2016   Procedure: ESOPHAGEAL MANOMETRY (EM);  Surgeon: Milus Banister, MD;  Location: [facility];  Service: Endoscopy;  Laterality: N/A;   HERNIA MESH REMOVAL     POLYPECTOMY     SHOULDER SURGERY Right    Current Outpatient Medications  Medication Sig Dispense Refill   acetaminophen (TYLENOL) 500 MG tablet Take 500-1,000 mg by mouth every 6 (six) hours as needed for moderate pain.     atorvastatin (LIPITOR) 80 MG tablet Take 80 mg by mouth daily.     Cholecalciferol (D-3-5 PO) Take by mouth.     diphenhydrAMINE (BENADRYL) 25 MG tablet Take 25 mg by mouth every 6 (six) hours as needed for allergies.     ezetimibe (ZETIA) 10 MG tablet Take 10 mg by mouth daily.     ibuprofen (ADVIL) 800 MG tablet Take 1 tablet (800 mg total) by mouth every 8 (eight) hours as needed. 30 tablet 0   losartan (COZAAR) 25 MG tablet Take 25 mg by mouth daily.     meloxicam (MOBIC) 15 MG tablet Take 15 mg by mouth daily.     Multiple Vitamins-Minerals (CENTRUM SILVER 50+WOMEN) TABS Take 1 tablet by mouth daily.     oxyCODONE  (OXY IR/ROXICODONE) 5 MG immediate release tablet Take 1 tablet (5 mg total) by mouth every 6 (six) hours as needed for severe pain. (Patient not taking: Reported on 07/22/2022) 15 tablet 0   vitamin B-12 (CYANOCOBALAMIN) 500 MCG tablet Take 500 mcg by mouth daily.     No current facility-administered medications for this visit.    Current Outpatient Medications:    acetaminophen (TYLENOL) 500 MG tablet, Take 500-1,000 mg by mouth every 6 (six) hours as needed for moderate pain., Disp: , Rfl:    atorvastatin (LIPITOR) 80 MG tablet, Take 80 mg by mouth daily., Disp: , Rfl:    Cholecalciferol (D-3-5 PO), Take by mouth., Disp: , Rfl:    diphenhydrAMINE (BENADRYL) 25 MG tablet, Take 25 mg by mouth every 6 (six) hours as needed for allergies., Disp: , Rfl:    ezetimibe (ZETIA) 10 MG tablet, Take 10 mg by mouth daily., Disp: , Rfl:    ibuprofen (ADVIL) 800 MG tablet, Take 1 tablet (800 mg total) by mouth every 8 (eight) hours as needed., Disp: 30 tablet, Rfl: 0   losartan (COZAAR) 25 MG tablet, Take 25 mg by mouth daily., Disp: , Rfl:    meloxicam (MOBIC) 15 MG tablet, Take 15 mg by mouth daily., Disp: ,  Rfl:    Multiple Vitamins-Minerals (CENTRUM SILVER 50+WOMEN) TABS, Take 1 tablet by mouth daily., Disp: , Rfl:    oxyCODONE (OXY IR/ROXICODONE) 5 MG immediate release tablet, Take 1 tablet (5 mg total) by mouth every 6 (six) hours as needed for severe pain. (Patient not taking: Reported on 07/22/2022), Disp: 15 tablet, Rfl: 0   vitamin B-12 (CYANOCOBALAMIN) 500 MCG tablet, Take 500 mcg by mouth daily., Disp: , Rfl:  No Known Allergies Family History  Problem Relation Age of Onset   Alzheimer's disease Mother    Stroke Mother    Heart attack Mother    Heart disease Mother    Breast cancer Maternal Grandmother    Hypertension Maternal Grandmother    Ovarian cancer Paternal Grandmother    Heart attack Maternal Aunt    Thyroid disease Daughter    Colon polyps Sister    Colon polyps Brother    Colon  cancer Neg Hx    Esophageal cancer Neg Hx    Stomach cancer Neg Hx    Rectal cancer Neg Hx    Diabetes Neg Hx    Gallbladder disease Neg Hx    Social History   Socioeconomic History   Marital status: Married    Spouse name: Not on file   Number of children: 2   Years of education: Not on file   Highest education level: Not on file  Occupational History   Occupation: retired  Tobacco Use   Smoking status: Never   Smokeless tobacco: Never  Vaping Use   Vaping Use: Never used  Substance and Sexual Activity   Alcohol use: No    Alcohol/week: 0.0 standard drinks of alcohol   Drug use: No   Sexual activity: Not Currently    Birth control/protection: Surgical  Other Topics Concern   Not on file  Social History Narrative   Tobacco use cigarettes :Never smoked ,   Tobacco history last updated 07/15/2013.no smoking   Alcohol : Rare no Recreational drugs use. Exercise: very active at work ,no formal exercise. 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 Marital Status: Married Scientist, forensic. Children : Melanie ([place]) Monica (W/S), 3 steps Denman George 12 grands, 1 great grand. Religion: Love and Faith         Social Determinants of Radio broadcast assistant Strain: Not on file  Food Insecurity: Not on file  Transportation Needs: Not on file  Physical Activity: Not on file  Stress: Not on file  Social Connections: Not on file  Intimate Partner Violence: Not on file    Physical Exam: Today's Vitals   08/09/22 1011  BP: 117/69  Pulse: 86  Temp: 98.2 F (36.8 C)  TempSrc: Skin  SpO2: 97%  Weight: 172 lb (78 kg)  Height: '5\' 1"'$  (1.549 m)   Body mass index is 32.5 kg/m. GEN: NAD EYE: Sclerae anicteric ENT: MMM CV: Non-tachycardic GI: Soft, NT/ND NEURO:  Alert & Oriented x 3  Lab Results: No results for input(s): "WBC", "HGB", "HCT", "PLT" in the last 72 hours. BMET No results for input(s): "NA", "K", "CL", "CO2", "GLUCOSE", "BUN", "CREATININE", "CALCIUM" in the  last 72 hours. LFT No results for input(s): "PROT", "ALBUMIN", "AST", "ALT", "ALKPHOS", "BILITOT", "BILIDIR", "IBILI" in the last 72 hours. PT/INR No results for input(s): "LABPROT", "INR" in the last 72 hours.   Impression / Plan: This is a 72 y.o.female who presents for Screening colonoscopy.  The risks and benefits of endoscopic evaluation/treatment were discussed with the patient and/or family; these  include but are not limited to the risk of perforation, infection, bleeding, missed lesions, lack of diagnosis, severe illness requiring hospitalization, as well as anesthesia and sedation related illnesses.  The patient's history has been reviewed, patient examined, no change in status, and deemed stable for procedure.  The patient and/or family is agreeable to proceed.    Justice Britain, MD [facility] Office # [phone]

## 2022-08-09 NOTE — Op Note (Signed)
[facility] Patient Name: Lindsay Bass Procedure Date: 08/09/2022 10:46 AM MRN: [id number] Endoscopist: Justice Britain , MD, [id number] Age: 72 Referring MD:  Date of Birth: 31-Oct-1950 Gender: Female Account #: [id number] Procedure:                Colonoscopy Indications:              Screening for colorectal malignant neoplasm Medicines:                Monitored Anesthesia Care Procedure:                Pre-Anesthesia Assessment:                           - Prior to the procedure, a History and Physical                            was performed, and patient medications and                            allergies were reviewed. The patient's tolerance of                            previous anesthesia was also reviewed. The risks                            and benefits of the procedure and the sedation                            options and risks were discussed with the patient.                            All questions were answered, and informed consent                            was obtained. Prior Anticoagulants: The patient has                            taken no anticoagulant or antiplatelet agents. ASA                            Grade Assessment: II - A patient with mild systemic                            disease. After reviewing the risks and benefits,                            the patient was deemed in satisfactory condition to                            undergo the procedure.                           After obtaining informed consent, the colonoscope  was passed under direct vision. Throughout the                            procedure, the patient's blood pressure, pulse, and                            oxygen saturations were monitored continuously. The                            CF [id number] was introduced through the anus                            and advanced to the the cecum, identified by                             appendiceal orifice and ileocecal valve. The                            colonoscopy was performed without difficulty. The                            patient tolerated the procedure. The quality of the                            bowel preparation was adequate. The ileocecal                            valve, appendiceal orifice, and rectum were                            photographed. Scope In: 10:59:12 AM Scope Out: 11:13:57 AM Scope Withdrawal Time: 0 hours 10 minutes 36 seconds  Total Procedure Duration: 0 hours 14 minutes 45 seconds  Findings:                 Skin tags were found on perianal exam.                           The digital rectal exam findings include                            hemorrhoids. Pertinent negatives include no                            palpable rectal lesions.                           The left colon was moderately tortuous.                           Two sessile polyps were found in the transverse                            colon and hepatic flexure. The polyps were 2 to 6  mm in size. These polyps were removed with a cold                            snare. Resection and retrieval were complete.                           Normal mucosa was found in the entire colon                            otherwise.                           Non-bleeding non-thrombosed external and internal                            hemorrhoids were found during retroflexion, during                            perianal exam and during digital exam. The                            hemorrhoids were Grade II (internal hemorrhoids                            that prolapse but reduce spontaneously). Complications:            No immediate complications. Estimated Blood Loss:     Estimated blood loss was minimal. Impression:               - Perianal skin tags found on perianal exam.                           - Hemorrhoids found on digital rectal exam.                            - Tortuous left colon.                           - Two 2 to 6 mm polyps in the transverse colon and                            at the hepatic flexure, removed with a cold snare.                            Resected and retrieved.                           - Normal mucosa in the entire examined colon                            otherwise.                           - Non-bleeding non-thrombosed external and internal  hemorrhoids. Recommendation:           - The patient will be observed post-procedure,                            until all discharge criteria are met.                           - Discharge patient to home.                           - Patient has a contact number available for                            emergencies. The signs and symptoms of potential                            delayed complications were discussed with the                            patient. Return to normal activities tomorrow.                            Written discharge instructions were provided to the                            patient.                           - High fiber diet.                           - Use FiberCon 1-2 tablets PO daily.                           - Continue present medications.                           - Await pathology results.                           - Repeat colonoscopy in 5-7 years for surveillance                            based on pathology results (and based on patient's                            overall health at the time as she will be 77 and 79                            at that time).                           - The findings and recommendations were discussed  with the patient.                           - The findings and recommendations were discussed                            with the designated responsible adult. Justice Britain, MD 08/09/2022 11:19:33 AM

## 2022-08-09 NOTE — Progress Notes (Signed)
Pt's states no medical or surgical changes since previsit or office visit. 

## 2022-08-10 ENCOUNTER — Telehealth: Payer: Self-pay

## 2022-08-10 NOTE — Telephone Encounter (Signed)
  Follow up Call-     08/09/2022   10:13 AM  Call back number  Post procedure Call Back phone  # [phone]  Permission to leave phone message Yes     Patient questions:  Do you have a fever, pain , or abdominal swelling? No. Pain Score  0 *  Have you tolerated food without any problems? Yes.    Have you been able to return to your normal activities? Yes.    Do you have any questions about your discharge instructions: Diet   No. Medications  No. Follow up visit  No.  Do you have questions or concerns about your Care? No.  Actions: * If pain score is 4 or above: No action needed, pain <4.

## 2022-08-13 ENCOUNTER — Encounter: Payer: Self-pay | Admitting: Gastroenterology

## 2022-08-23 DIAGNOSIS — G4733 Obstructive sleep apnea (adult) (pediatric): Secondary | ICD-10-CM | POA: Diagnosis not present

## 2022-08-27 DIAGNOSIS — G4733 Obstructive sleep apnea (adult) (pediatric): Secondary | ICD-10-CM | POA: Diagnosis not present

## 2022-08-31 DIAGNOSIS — M459 Ankylosing spondylitis of unspecified sites in spine: Secondary | ICD-10-CM | POA: Diagnosis not present

## 2022-08-31 DIAGNOSIS — M1990 Unspecified osteoarthritis, unspecified site: Secondary | ICD-10-CM | POA: Diagnosis not present

## 2022-08-31 DIAGNOSIS — I10 Essential (primary) hypertension: Secondary | ICD-10-CM | POA: Diagnosis not present

## 2022-08-31 DIAGNOSIS — D472 Monoclonal gammopathy: Secondary | ICD-10-CM | POA: Diagnosis not present

## 2022-08-31 DIAGNOSIS — E785 Hyperlipidemia, unspecified: Secondary | ICD-10-CM | POA: Diagnosis not present

## 2022-08-31 DIAGNOSIS — J301 Allergic rhinitis due to pollen: Secondary | ICD-10-CM | POA: Diagnosis not present

## 2022-08-31 DIAGNOSIS — M79674 Pain in right toe(s): Secondary | ICD-10-CM | POA: Diagnosis not present

## 2022-08-31 DIAGNOSIS — E041 Nontoxic single thyroid nodule: Secondary | ICD-10-CM | POA: Diagnosis not present

## 2022-08-31 DIAGNOSIS — G4733 Obstructive sleep apnea (adult) (pediatric): Secondary | ICD-10-CM | POA: Diagnosis not present

## 2022-09-07 DIAGNOSIS — G4733 Obstructive sleep apnea (adult) (pediatric): Secondary | ICD-10-CM | POA: Diagnosis not present

## 2022-09-12 ENCOUNTER — Ambulatory Visit (INDEPENDENT_AMBULATORY_CARE_PROVIDER_SITE_OTHER): Payer: Medicare Other

## 2022-09-12 ENCOUNTER — Encounter: Payer: Self-pay | Admitting: Podiatry

## 2022-09-12 ENCOUNTER — Ambulatory Visit: Payer: Medicare Other | Admitting: Podiatry

## 2022-09-12 DIAGNOSIS — M79674 Pain in right toe(s): Secondary | ICD-10-CM

## 2022-09-12 DIAGNOSIS — L600 Ingrowing nail: Secondary | ICD-10-CM | POA: Diagnosis not present

## 2022-09-12 NOTE — Progress Notes (Signed)
  Subjective:  Patient ID: Lindsay Bass, female    DOB: 31-Aug-1950,   MRN: [id number]  Chief Complaint  Patient presents with   Toe Pain    Patient is here today for right great toe pain that she has been going on for over 6 months now. Patient states that she has pain off and on but not consistent pain.    72 y.o. female presents for concern of right great toe pain that has been going on since last year and at least 6 months. Relates the pain will come and go and most painful going up and down the stairs. Relates a history of arthritis but also concerned about the nail causing the pain as well. Denies any current treatments . Denies any other pedal complaints. Denies n/v/f/c.   Past Medical History:  Diagnosis Date   Allergy    Anemia    Ankylosing spondylitis ([place])    Colon polyps 2013   Fibromyalgia 2015   High cholesterol    Hypertension    IgA monoclonal gammopathy    Obesity 2005   Sleep apnea    sometimes wear CPAP   Sleep apnea    Urinary tract infection 2015   Urticaria     Objective:  Physical Exam: Vascular: DP/PT pulses 2/4 bilateral. CFT <3 seconds. Normal hair growth on digits. No edema.  Skin. No lacerations or abrasions bilateral feet. Lateral border or right hallux incurvated. No pain to palpation No erythema edema or purulence noted.  Musculoskeletal: MMT 5/5 bilateral lower extremities in DF, PF, Inversion and Eversion. Deceased ROM in DF of ankle joint.  No pain to palpation about the right great toe today but relates the whole toe will ache when in pain.  Neurological: Sensation intact to light touch.   Assessment:   1. Pain of right great toe   2. Ingrown right greater toenail      Plan:  Patient was evaluated and treated and all questions answered. -Xrays reviewed. No acute fractures or dislocations Some degenerative changes and joint space narrowing of first MPJ and hallux IPJ. Brachymetatarsaia noted of fourth metatarsal.  -Discussed hallux  limitus and  treatement options; conservative and  Surgical management; risks, benefits, alternatives discussed. All patient's questions answered. -Continue meloxicam prescription she has.  -Deferred injection -Recommend continue with good supportive shoes and inserts. Discussed stiff soled shoes and use of carbon fiber foot plate.   -Patient to return to office as needed or sooner if condition worsens.  Discussed ingrown toenails etiology and treatment options including procedure for removal vs conservative care.  Hallux nail debrided without incident as courtesy.  Follow-up as needed.      Lorenda Peck, DPM

## 2022-10-08 DIAGNOSIS — G4733 Obstructive sleep apnea (adult) (pediatric): Secondary | ICD-10-CM | POA: Diagnosis not present

## 2022-11-07 DIAGNOSIS — G4733 Obstructive sleep apnea (adult) (pediatric): Secondary | ICD-10-CM | POA: Diagnosis not present

## 2022-12-08 DIAGNOSIS — G4733 Obstructive sleep apnea (adult) (pediatric): Secondary | ICD-10-CM | POA: Diagnosis not present

## 2022-12-09 DIAGNOSIS — D649 Anemia, unspecified: Secondary | ICD-10-CM | POA: Diagnosis not present

## 2022-12-09 DIAGNOSIS — D696 Thrombocytopenia, unspecified: Secondary | ICD-10-CM | POA: Diagnosis not present

## 2022-12-09 DIAGNOSIS — D472 Monoclonal gammopathy: Secondary | ICD-10-CM | POA: Diagnosis not present

## 2023-01-07 DIAGNOSIS — G4733 Obstructive sleep apnea (adult) (pediatric): Secondary | ICD-10-CM | POA: Diagnosis not present

## 2023-02-07 DIAGNOSIS — G4733 Obstructive sleep apnea (adult) (pediatric): Secondary | ICD-10-CM | POA: Diagnosis not present

## 2023-02-28 DIAGNOSIS — G4733 Obstructive sleep apnea (adult) (pediatric): Secondary | ICD-10-CM | POA: Diagnosis not present

## 2023-03-13 DIAGNOSIS — D472 Monoclonal gammopathy: Secondary | ICD-10-CM | POA: Diagnosis not present

## 2023-03-13 DIAGNOSIS — E559 Vitamin D deficiency, unspecified: Secondary | ICD-10-CM | POA: Diagnosis not present

## 2023-03-13 DIAGNOSIS — Z0000 Encounter for general adult medical examination without abnormal findings: Secondary | ICD-10-CM | POA: Diagnosis not present

## 2023-03-13 DIAGNOSIS — I10 Essential (primary) hypertension: Secondary | ICD-10-CM | POA: Diagnosis not present

## 2023-03-13 DIAGNOSIS — M7989 Other specified soft tissue disorders: Secondary | ICD-10-CM | POA: Diagnosis not present

## 2023-03-13 DIAGNOSIS — D696 Thrombocytopenia, unspecified: Secondary | ICD-10-CM | POA: Diagnosis not present

## 2023-03-13 DIAGNOSIS — E785 Hyperlipidemia, unspecified: Secondary | ICD-10-CM | POA: Diagnosis not present

## 2023-03-13 DIAGNOSIS — E538 Deficiency of other specified B group vitamins: Secondary | ICD-10-CM | POA: Diagnosis not present

## 2023-03-13 DIAGNOSIS — E041 Nontoxic single thyroid nodule: Secondary | ICD-10-CM | POA: Diagnosis not present

## 2023-03-13 DIAGNOSIS — Z23 Encounter for immunization: Secondary | ICD-10-CM | POA: Diagnosis not present

## 2023-03-13 DIAGNOSIS — G4733 Obstructive sleep apnea (adult) (pediatric): Secondary | ICD-10-CM | POA: Diagnosis not present

## 2023-03-14 ENCOUNTER — Ambulatory Visit
Admission: RE | Admit: 2023-03-14 | Discharge: 2023-03-14 | Disposition: A | Discharge status: Home / Self Care | Payer: Medicare Other | Source: Ambulatory Visit | Attending: Family Medicine | Admitting: Family Medicine

## 2023-03-14 ENCOUNTER — Other Ambulatory Visit: Payer: Self-pay | Admitting: Family Medicine

## 2023-03-14 DIAGNOSIS — R7989 Other specified abnormal findings of blood chemistry: Secondary | ICD-10-CM | POA: Diagnosis not present

## 2023-03-14 DIAGNOSIS — R600 Localized edema: Secondary | ICD-10-CM | POA: Diagnosis not present

## 2023-03-14 DIAGNOSIS — M7989 Other specified soft tissue disorders: Secondary | ICD-10-CM

## 2023-03-14 DIAGNOSIS — O2230 Deep phlebothrombosis in pregnancy, unspecified trimester: Secondary | ICD-10-CM

## 2023-03-24 DIAGNOSIS — N641 Fat necrosis of breast: Secondary | ICD-10-CM | POA: Diagnosis not present

## 2023-03-24 DIAGNOSIS — N644 Mastodynia: Secondary | ICD-10-CM | POA: Diagnosis not present

## 2023-04-25 DIAGNOSIS — R051 Acute cough: Secondary | ICD-10-CM | POA: Diagnosis not present

## 2023-04-25 DIAGNOSIS — I10 Essential (primary) hypertension: Secondary | ICD-10-CM | POA: Diagnosis not present

## 2023-06-08 DIAGNOSIS — I10 Essential (primary) hypertension: Secondary | ICD-10-CM | POA: Diagnosis not present

## 2023-06-08 DIAGNOSIS — R600 Localized edema: Secondary | ICD-10-CM | POA: Diagnosis not present

## 2023-06-12 DIAGNOSIS — D472 Monoclonal gammopathy: Secondary | ICD-10-CM | POA: Diagnosis not present

## 2023-06-12 DIAGNOSIS — D649 Anemia, unspecified: Secondary | ICD-10-CM | POA: Diagnosis not present

## 2023-06-12 DIAGNOSIS — D696 Thrombocytopenia, unspecified: Secondary | ICD-10-CM | POA: Diagnosis not present

## 2023-06-16 DIAGNOSIS — H269 Unspecified cataract: Secondary | ICD-10-CM | POA: Diagnosis not present

## 2023-06-16 DIAGNOSIS — H5712 Ocular pain, left eye: Secondary | ICD-10-CM | POA: Diagnosis not present

## 2023-06-16 DIAGNOSIS — I868 Varicose veins of other specified sites: Secondary | ICD-10-CM | POA: Diagnosis not present

## 2023-07-17 DIAGNOSIS — M7062 Trochanteric bursitis, left hip: Secondary | ICD-10-CM | POA: Diagnosis not present

## 2023-07-17 DIAGNOSIS — M79605 Pain in left leg: Secondary | ICD-10-CM | POA: Diagnosis not present

## 2023-07-17 DIAGNOSIS — M1991 Primary osteoarthritis, unspecified site: Secondary | ICD-10-CM | POA: Diagnosis not present

## 2023-07-17 DIAGNOSIS — M459 Ankylosing spondylitis of unspecified sites in spine: Secondary | ICD-10-CM | POA: Diagnosis not present

## 2023-07-17 DIAGNOSIS — M5459 Other low back pain: Secondary | ICD-10-CM | POA: Diagnosis not present

## 2023-07-17 DIAGNOSIS — M461 Sacroiliitis, not elsewhere classified: Secondary | ICD-10-CM | POA: Diagnosis not present

## 2023-07-20 DIAGNOSIS — R600 Localized edema: Secondary | ICD-10-CM | POA: Diagnosis not present

## 2023-09-01 DIAGNOSIS — J019 Acute sinusitis, unspecified: Secondary | ICD-10-CM | POA: Diagnosis not present

## 2023-09-01 DIAGNOSIS — R059 Cough, unspecified: Secondary | ICD-10-CM | POA: Diagnosis not present

## 2023-09-18 DIAGNOSIS — R413 Other amnesia: Secondary | ICD-10-CM | POA: Diagnosis not present

## 2023-09-18 DIAGNOSIS — G4733 Obstructive sleep apnea (adult) (pediatric): Secondary | ICD-10-CM | POA: Diagnosis not present

## 2023-09-18 DIAGNOSIS — J301 Allergic rhinitis due to pollen: Secondary | ICD-10-CM | POA: Diagnosis not present

## 2023-09-18 DIAGNOSIS — R600 Localized edema: Secondary | ICD-10-CM | POA: Diagnosis not present

## 2023-09-18 DIAGNOSIS — D638 Anemia in other chronic diseases classified elsewhere: Secondary | ICD-10-CM | POA: Diagnosis not present

## 2023-09-18 DIAGNOSIS — D472 Monoclonal gammopathy: Secondary | ICD-10-CM | POA: Diagnosis not present

## 2023-09-18 DIAGNOSIS — I10 Essential (primary) hypertension: Secondary | ICD-10-CM | POA: Diagnosis not present

## 2023-09-18 DIAGNOSIS — M25552 Pain in left hip: Secondary | ICD-10-CM | POA: Diagnosis not present

## 2023-09-25 DIAGNOSIS — R922 Inconclusive mammogram: Secondary | ICD-10-CM | POA: Diagnosis not present

## 2023-11-10 DIAGNOSIS — M19011 Primary osteoarthritis, right shoulder: Secondary | ICD-10-CM | POA: Diagnosis not present

## 2023-12-08 DIAGNOSIS — M25511 Pain in right shoulder: Secondary | ICD-10-CM | POA: Diagnosis not present

## 2023-12-11 DIAGNOSIS — E785 Hyperlipidemia, unspecified: Secondary | ICD-10-CM | POA: Diagnosis not present

## 2023-12-11 DIAGNOSIS — M1990 Unspecified osteoarthritis, unspecified site: Secondary | ICD-10-CM | POA: Diagnosis not present

## 2023-12-12 DIAGNOSIS — M450 Ankylosing spondylitis of multiple sites in spine: Secondary | ICD-10-CM | POA: Diagnosis not present

## 2023-12-12 DIAGNOSIS — D472 Monoclonal gammopathy: Secondary | ICD-10-CM | POA: Diagnosis not present

## 2023-12-12 DIAGNOSIS — D649 Anemia, unspecified: Secondary | ICD-10-CM | POA: Diagnosis not present

## 2023-12-12 DIAGNOSIS — D696 Thrombocytopenia, unspecified: Secondary | ICD-10-CM | POA: Diagnosis not present

## 2023-12-18 DIAGNOSIS — R052 Subacute cough: Secondary | ICD-10-CM | POA: Diagnosis not present

## 2023-12-18 DIAGNOSIS — D638 Anemia in other chronic diseases classified elsewhere: Secondary | ICD-10-CM | POA: Diagnosis not present

## 2023-12-18 DIAGNOSIS — G4733 Obstructive sleep apnea (adult) (pediatric): Secondary | ICD-10-CM | POA: Diagnosis not present

## 2023-12-18 DIAGNOSIS — R600 Localized edema: Secondary | ICD-10-CM | POA: Diagnosis not present

## 2023-12-20 DIAGNOSIS — R262 Difficulty in walking, not elsewhere classified: Secondary | ICD-10-CM | POA: Diagnosis not present

## 2023-12-20 DIAGNOSIS — M6281 Muscle weakness (generalized): Secondary | ICD-10-CM | POA: Diagnosis not present

## 2023-12-20 DIAGNOSIS — S39012D Strain of muscle, fascia and tendon of lower back, subsequent encounter: Secondary | ICD-10-CM | POA: Diagnosis not present

## 2024-01-11 DIAGNOSIS — E785 Hyperlipidemia, unspecified: Secondary | ICD-10-CM | POA: Diagnosis not present

## 2024-01-11 DIAGNOSIS — M1990 Unspecified osteoarthritis, unspecified site: Secondary | ICD-10-CM | POA: Diagnosis not present

## 2024-02-11 DIAGNOSIS — E785 Hyperlipidemia, unspecified: Secondary | ICD-10-CM | POA: Diagnosis not present

## 2024-02-11 DIAGNOSIS — M1990 Unspecified osteoarthritis, unspecified site: Secondary | ICD-10-CM | POA: Diagnosis not present

## 2024-03-05 DIAGNOSIS — I10 Essential (primary) hypertension: Secondary | ICD-10-CM | POA: Diagnosis not present

## 2024-03-12 DIAGNOSIS — E785 Hyperlipidemia, unspecified: Secondary | ICD-10-CM | POA: Diagnosis not present

## 2024-03-12 DIAGNOSIS — M1990 Unspecified osteoarthritis, unspecified site: Secondary | ICD-10-CM | POA: Diagnosis not present

## 2024-03-21 DIAGNOSIS — E538 Deficiency of other specified B group vitamins: Secondary | ICD-10-CM | POA: Diagnosis not present

## 2024-03-21 DIAGNOSIS — I10 Essential (primary) hypertension: Secondary | ICD-10-CM | POA: Diagnosis not present

## 2024-03-21 DIAGNOSIS — E785 Hyperlipidemia, unspecified: Secondary | ICD-10-CM | POA: Diagnosis not present

## 2024-03-21 DIAGNOSIS — G4733 Obstructive sleep apnea (adult) (pediatric): Secondary | ICD-10-CM | POA: Diagnosis not present

## 2024-03-21 DIAGNOSIS — R052 Subacute cough: Secondary | ICD-10-CM | POA: Diagnosis not present

## 2024-03-21 DIAGNOSIS — R600 Localized edema: Secondary | ICD-10-CM | POA: Diagnosis not present

## 2024-03-21 DIAGNOSIS — E041 Nontoxic single thyroid nodule: Secondary | ICD-10-CM | POA: Diagnosis not present

## 2024-03-21 DIAGNOSIS — Z0000 Encounter for general adult medical examination without abnormal findings: Secondary | ICD-10-CM | POA: Diagnosis not present

## 2024-03-21 DIAGNOSIS — D472 Monoclonal gammopathy: Secondary | ICD-10-CM | POA: Diagnosis not present

## 2024-03-21 DIAGNOSIS — E559 Vitamin D deficiency, unspecified: Secondary | ICD-10-CM | POA: Diagnosis not present

## 2024-03-21 DIAGNOSIS — Z23 Encounter for immunization: Secondary | ICD-10-CM | POA: Diagnosis not present

## 2024-03-21 DIAGNOSIS — D638 Anemia in other chronic diseases classified elsewhere: Secondary | ICD-10-CM | POA: Diagnosis not present

## 2024-03-26 DIAGNOSIS — Z1231 Encounter for screening mammogram for malignant neoplasm of breast: Secondary | ICD-10-CM | POA: Diagnosis not present

## 2024-04-10 DIAGNOSIS — R600 Localized edema: Secondary | ICD-10-CM | POA: Diagnosis not present

## 2024-04-10 DIAGNOSIS — I83893 Varicose veins of bilateral lower extremities with other complications: Secondary | ICD-10-CM | POA: Diagnosis not present

## 2024-04-10 DIAGNOSIS — I872 Venous insufficiency (chronic) (peripheral): Secondary | ICD-10-CM | POA: Diagnosis not present

## 2024-04-10 DIAGNOSIS — I890 Lymphedema, not elsewhere classified: Secondary | ICD-10-CM | POA: Diagnosis not present

## 2024-04-10 DIAGNOSIS — M79662 Pain in left lower leg: Secondary | ICD-10-CM | POA: Diagnosis not present
